# Patient Record
Sex: Female | Born: 1960 | Race: Black or African American | Hispanic: No | Marital: Single | Smoking: Current every day smoker
Health system: Southern US, Community
[De-identification: ages and names within clinical notes are randomized; demographics above are authoritative.]

## PROBLEM LIST (undated history)

## (undated) DIAGNOSIS — F200 Paranoid schizophrenia: Secondary | ICD-10-CM

---

## 2000-08-14 ENCOUNTER — Encounter: Admission: RE | Admit: 2000-08-14 | Discharge: 2000-08-14 | Payer: Self-pay | Admitting: *Deleted

## 2000-08-14 ENCOUNTER — Other Ambulatory Visit: Admission: RE | Admit: 2000-08-14 | Discharge: 2000-08-14 | Payer: Self-pay | Admitting: Internal Medicine

## 2004-08-11 ENCOUNTER — Ambulatory Visit: Payer: Self-pay | Admitting: Internal Medicine

## 2004-08-11 ENCOUNTER — Inpatient Hospital Stay (HOSPITAL_COMMUNITY): Admission: EM | Admit: 2004-08-11 | Discharge: 2004-08-14 | Payer: Self-pay | Admitting: Emergency Medicine

## 2005-03-31 ENCOUNTER — Inpatient Hospital Stay (HOSPITAL_COMMUNITY): Admission: RE | Admit: 2005-03-31 | Discharge: 2005-04-03 | Payer: Self-pay | Admitting: Obstetrics

## 2005-03-31 ENCOUNTER — Encounter (INDEPENDENT_AMBULATORY_CARE_PROVIDER_SITE_OTHER): Payer: Self-pay | Admitting: *Deleted

## 2008-07-25 ENCOUNTER — Emergency Department (HOSPITAL_COMMUNITY): Admission: EM | Admit: 2008-07-25 | Discharge: 2008-07-25 | Payer: Self-pay | Admitting: Emergency Medicine

## 2008-09-09 ENCOUNTER — Emergency Department (HOSPITAL_COMMUNITY): Admission: EM | Admit: 2008-09-09 | Discharge: 2008-09-09 | Payer: Self-pay | Admitting: Emergency Medicine

## 2008-09-15 ENCOUNTER — Emergency Department (HOSPITAL_BASED_OUTPATIENT_CLINIC_OR_DEPARTMENT_OTHER): Admission: EM | Admit: 2008-09-15 | Discharge: 2008-09-15 | Payer: Self-pay | Admitting: Emergency Medicine

## 2009-01-10 ENCOUNTER — Emergency Department (HOSPITAL_COMMUNITY): Admission: EM | Admit: 2009-01-10 | Discharge: 2009-01-10 | Payer: Self-pay | Admitting: Emergency Medicine

## 2009-02-05 ENCOUNTER — Emergency Department (HOSPITAL_COMMUNITY): Admission: EM | Admit: 2009-02-05 | Discharge: 2009-02-05 | Payer: Self-pay | Admitting: Emergency Medicine

## 2009-02-13 ENCOUNTER — Inpatient Hospital Stay (HOSPITAL_COMMUNITY): Admission: AD | Admit: 2009-02-13 | Discharge: 2009-02-24 | Payer: Self-pay | Admitting: Psychiatry

## 2009-02-13 ENCOUNTER — Other Ambulatory Visit: Payer: Self-pay | Admitting: Emergency Medicine

## 2009-02-13 ENCOUNTER — Ambulatory Visit: Payer: Self-pay | Admitting: Psychiatry

## 2009-05-06 ENCOUNTER — Emergency Department (HOSPITAL_COMMUNITY): Admission: EM | Admit: 2009-05-06 | Discharge: 2009-05-06 | Payer: Self-pay | Admitting: Emergency Medicine

## 2009-08-28 ENCOUNTER — Emergency Department (HOSPITAL_COMMUNITY): Admission: EM | Admit: 2009-08-28 | Discharge: 2009-08-28 | Payer: Self-pay | Admitting: Emergency Medicine

## 2009-09-10 ENCOUNTER — Emergency Department (HOSPITAL_COMMUNITY): Admission: EM | Admit: 2009-09-10 | Discharge: 2009-09-10 | Payer: Self-pay | Admitting: Emergency Medicine

## 2009-10-14 ENCOUNTER — Emergency Department (HOSPITAL_COMMUNITY): Admission: EM | Admit: 2009-10-14 | Discharge: 2009-10-14 | Payer: Self-pay | Admitting: Emergency Medicine

## 2009-11-24 ENCOUNTER — Emergency Department (HOSPITAL_COMMUNITY): Admission: EM | Admit: 2009-11-24 | Discharge: 2009-11-24 | Payer: Self-pay | Admitting: Emergency Medicine

## 2010-01-11 ENCOUNTER — Emergency Department (HOSPITAL_COMMUNITY): Admission: EM | Admit: 2010-01-11 | Discharge: 2010-01-11 | Payer: Self-pay | Admitting: Emergency Medicine

## 2011-02-02 LAB — CBC
Hemoglobin: 15.1 g/dL — ABNORMAL HIGH (ref 12.0–15.0)
Platelets: 163 10*3/uL (ref 150–400)
WBC: 5.3 10*3/uL (ref 4.0–10.5)

## 2011-02-02 LAB — BASIC METABOLIC PANEL
BUN: 9 mg/dL (ref 6–23)
CO2: 25 mEq/L (ref 19–32)
Chloride: 105 mEq/L (ref 96–112)
Creatinine, Ser: 0.77 mg/dL (ref 0.4–1.2)
GFR calc non Af Amer: >60 mL/min (ref >60)
Glucose, Bld: 84 mg/dL (ref 70–99)
Potassium: 4.3 mEq/L (ref 3.5–5.1)

## 2011-02-02 LAB — URINALYSIS, ROUTINE W REFLEX MICROSCOPIC
Hgb urine dipstick: NEGATIVE
Nitrite: NEGATIVE
Protein, ur: NEGATIVE mg/dL
Specific Gravity, Urine: 1.024 (ref 1.005–1.030)
pH: 6 (ref 5.0–8.0)

## 2011-02-02 LAB — DIFFERENTIAL
Eosinophils Absolute: 0.1 10*3/uL (ref 0.0–0.7)
Lymphocytes Relative: 40 % (ref 12–46)
Monocytes Absolute: 0.5 10*3/uL (ref 0.1–1.0)

## 2011-02-17 LAB — URINALYSIS, ROUTINE W REFLEX MICROSCOPIC
Bilirubin Urine: NEGATIVE
Glucose, UA: NEGATIVE mg/dL
Nitrite: NEGATIVE
Protein, ur: NEGATIVE mg/dL
Specific Gravity, Urine: 1.015 (ref 1.005–1.030)
Urobilinogen, UA: 0.2 mg/dL (ref 0.0–1.0)
pH: 7.5 (ref 5.0–8.0)

## 2011-02-17 LAB — WET PREP, GENITAL
Trich, Wet Prep: NONE SEEN
WBC, Wet Prep HPF POC: NONE SEEN

## 2011-02-17 LAB — DIFFERENTIAL
Basophils Relative: 0 % (ref 0–1)
Eosinophils Absolute: 0.1 10*3/uL (ref 0.0–0.7)
Eosinophils Relative: 1 % (ref 0–5)
Monocytes Relative: 7 % (ref 3–12)
Neutro Abs: 6.7 10*3/uL (ref 1.7–7.7)
Neutrophils Relative %: 75 % (ref 43–77)

## 2011-02-17 LAB — ETHANOL: Alcohol, Ethyl (B): <5 mg/dL (ref 0–10)

## 2011-02-17 LAB — BASIC METABOLIC PANEL
CO2: 27 mEq/L (ref 19–32)
Creatinine, Ser: 0.81 mg/dL (ref 0.4–1.2)
Sodium: 138 mEq/L (ref 135–145)

## 2011-02-17 LAB — CBC
Hemoglobin: 12.8 g/dL (ref 12.0–15.0)
MCHC: 33.5 g/dL (ref 30.0–36.0)
WBC: 9 10*3/uL (ref 4.0–10.5)

## 2011-02-17 LAB — RAPID URINE DRUG SCREEN, HOSP PERFORMED
Barbiturates: NOT DETECTED
Opiates: NOT DETECTED

## 2011-02-17 LAB — RPR: RPR Ser Ql: NONREACTIVE

## 2011-02-21 LAB — URINALYSIS, ROUTINE W REFLEX MICROSCOPIC
Glucose, UA: NEGATIVE mg/dL
Protein, ur: NEGATIVE mg/dL
Urobilinogen, UA: 1 mg/dL (ref 0.0–1.0)

## 2011-02-21 LAB — POCT PREGNANCY, URINE: Preg Test, Ur: NEGATIVE

## 2011-02-21 LAB — CK: Total CK: 122 U/L (ref 7–177)

## 2011-02-21 LAB — DIFFERENTIAL
Basophils Absolute: 0 10*3/uL (ref 0.0–0.1)
Eosinophils Absolute: 0.1 10*3/uL (ref 0.0–0.7)
Lymphocytes Relative: 30 % (ref 12–46)
Lymphs Abs: 1.9 10*3/uL (ref 0.7–4.0)

## 2011-02-21 LAB — BASIC METABOLIC PANEL
CO2: 28 mEq/L (ref 19–32)
Chloride: 104 mEq/L (ref 96–112)
GFR calc Af Amer: >60 mL/min (ref >60)
Potassium: 4.5 mEq/L (ref 3.5–5.1)

## 2011-02-21 LAB — CBC
Hemoglobin: 15.2 g/dL — ABNORMAL HIGH (ref 12.0–15.0)
RBC: 4.76 MIL/uL (ref 3.87–5.11)

## 2011-02-23 LAB — COMPREHENSIVE METABOLIC PANEL
ALT: 14 U/L (ref 0–35)
AST: 19 U/L (ref 0–37)
Alkaline Phosphatase: 69 U/L (ref 39–117)
CO2: 27 mEq/L (ref 19–32)
Calcium: 9.5 mg/dL (ref 8.4–10.5)
GFR calc Af Amer: >60 mL/min (ref >60)
Glucose, Bld: 97 mg/dL (ref 70–99)
Potassium: 4.6 mEq/L (ref 3.5–5.1)
Sodium: 137 mEq/L (ref 135–145)
Total Protein: 7.4 g/dL (ref 6.0–8.3)

## 2011-02-23 LAB — DIFFERENTIAL
Basophils Relative: 0 % (ref 0–1)
Eosinophils Absolute: 0 10*3/uL (ref 0.0–0.7)
Eosinophils Relative: 1 % (ref 0–5)
Lymphs Abs: 1.2 10*3/uL (ref 0.7–4.0)
Monocytes Relative: 8 % (ref 3–12)

## 2011-02-23 LAB — RAPID URINE DRUG SCREEN, HOSP PERFORMED
Cocaine: NOT DETECTED
Tetrahydrocannabinol: NOT DETECTED

## 2011-02-23 LAB — ACETAMINOPHEN LEVEL: Acetaminophen (Tylenol), Serum: <10 ug/mL — ABNORMAL LOW (ref 10–30)

## 2011-02-23 LAB — CBC
Hemoglobin: 16.5 g/dL — ABNORMAL HIGH (ref 12.0–15.0)
MCHC: 34.3 g/dL (ref 30.0–36.0)
RBC: 5.1 MIL/uL (ref 3.87–5.11)
RDW: 13.8 % (ref 11.5–15.5)

## 2011-02-23 LAB — ETHANOL: Alcohol, Ethyl (B): <5 mg/dL (ref 0–10)
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
## 2011-04-01 NOTE — Discharge Summary (Signed)
[name], [name]               ACCOUNT NO.:  [id number]   MEDICAL RECORD NO.:  [id number]          PATIENT TYPE:  INP   LOCATION:  9303                          [facility]ILITY:  [facility]   PHYSICIAN:  Charles A. Clearance Coots, M.D.DATE OF BIRTH:  July 19, 1961   DATE OF ADMISSION:  03/31/2005  DATE OF DISCHARGE:  04/03/2005                                 DISCHARGE SUMMARY   ADMITTING DIAGNOSES:  Symptomatic uterine fibroids.   DISCHARGE DIAGNOSES:  Symptomatic uterine fibroids status post total  abdominal hysterectomy, left salpingo-oophorectomy.  Discharged home in good  condition.   REASON FOR ADMISSION:  A 50 year old black female G0, last menstrual period  January 31, 2005 presents to the office with a history of large uterine  fibroids that are very uncomfortable.  Patient also gives a history of  heavy, painful periods.  She also has a history of anemia with a hemoglobin  as low at 6.1 in September 2005.   PAST MEDICAL HISTORY:  Significant for paranoid schizophrenia.  She is on  Haldol.   PAST SURGICAL HISTORY:  None.   ILLNESSES:  Paranoid schizophrenia.   MEDICATIONS:  1.  Haldol.  2.  Iron.  3.  Provera.   SOCIAL HISTORY:  Single.  Positive tobacco, alcohol.  Negative recreational  drug use.   PHYSICAL EXAMINATION:  GENERAL:  Well-nourished, well-developed black female  in no acute distress.  VITAL SIGNS:  Temperature 98.4, pulse 66, blood pressure 136/68.  HEENT:  Poor dentition.  NECK:  Supple without adenopathy.  LUNGS:  Clear to auscultation bilaterally.  HEART:  Regular rate and rhythm.  ABDOMEN:  Palpable uterus approximately 18 weeks.  PELVIC:  Uterus approximately 18 weeks size on bimanual examination.  The  adnexa could not be appreciated due to the size of the uterus.   IMPRESSION:  Symptomatic uterine fibroids.   PLAN:  Total abdominal hysterectomy.   ADMISSION LABORATORIES:  Hemoglobin 13, hematocrit 40, white blood cell  count 6400, platelet count 310,000.   Coags were within normal limits.  Comprehensive metabolic panel was within normal limits.  Urinalysis was  within normal limits.   HOSPITAL COURSE:  Patient underwent a total abdominal hysterectomy and left  salpingo-oophorectomy on Mar 31, 2005.  There were no intraoperative  complications.  Postoperative course was uncomplicated.  Patient was  discharged home on postoperative day #2 in good condition.   DISCHARGE LABORATORIES:  Hemoglobin 10, hematocrit 30, white blood cell  count 8000, platelets 226,000.   DISCHARGE DISPOSITION:  Tylox and ibuprofen was prescribed for pain.  Continue iron.  Routine written instructions were given for discharge after  hysterectomy.  Patient is to call the office for a follow-up appointment in  two weeks.       CAH/MEDQ  D:  04/26/2005  T:  04/26/2005  Job:  [id number]
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
## 2011-04-01 NOTE — Op Note (Signed)
NAMESIGNA, CHEEK               ACCOUNT NO.:  [id number]   MEDICAL RECORD NO.:  [id number]          PATIENT TYPE:  INP   LOCATION:  9399                          [facility]ACILITY:  [facility]   PHYSICIAN:  Charles A. Clearance Coots, M.D.DATE OF BIRTH:  August 09, 1961   DATE OF PROCEDURE:  03/31/2005  DATE OF DISCHARGE:                                 OPERATIVE REPORT   PREOPERATIVE DIAGNOSIS:  Symptomatic uterine fibroids.   POSTOPERATIVE DIAGNOSIS:  Symptomatic uterine fibroids.   PROCEDURE:  Total abdominal hysterectomy, left salpingo-oophorectomy.   SURGEON:  Coral Ceo, M.D.   ASSISTANT:  Antionette Char, M.D.   ANESTHESIA:  General.   ESTIMATED BLOOD LOSS:  500 mL.   IV FLUIDS:  1900 mL.   URINE OUTPUT:  150 mL, clear   COMPLICATIONS:  None.   DRAINS:  Foley to gravity   FINDINGS:  Large fibroid uterus approximately 18-week size, left ovary  tightly adherent to uterus.   SPECIMENS:  The uterus, cervix, left ovary, left fallopian tube.   OPERATION:  The patient was brought to the operating room and after  satisfactory general endotracheal anesthesia, the abdomen was prepped and  draped in the usual sterile fashion.  A Pfannenstiel skin incision was made  with a scalpel that was deepened down to the fascia with a scalpel. Fascia  was nicked in the midline and the fascial incision was extended to the left  and to right with curved Mayo scissors. The superior and inferior fascial  edges were taken off the rectus muscle with both blunt and sharp dissection.  Rectus muscle was bluntly and sharply divided in the midline. The peritoneum  was entered digitally and was sharply extended superiorly and inferiorly  being careful to avoid the urinary bladder inferiorly. The uterus was then  exteriorized and the round ligaments were grasped with Kelly forceps  bilaterally and transected and suture ligated with transfixion sutures of  zero Vicryl. The anterior peritoneal reflection of the  urinary bladder was  then undermined with Metzenbaum scissors and the incision was extended to  left and to the right with Metzenbaum scissors and the urinary bladder was  pushed down away from the lower uterine segment and cervix, well out of the  operative field. The broad ligament on the right side was then tented  distally, medial aspect of the broad ligament close to the uterus and the  pedicle including the broad ligament, fallopian tube, and utero-ovarian  ligaments were doubly clamps with parametrial forceps and cut in between  with curved Mayo scissors. Pedicle with free tie of zero Vicryl was then  placed beneath the clamp and a suture ligation of transfixion suture of zero  Vicryl was placed above the knot. On the left side the ovary was tightly  adhered to the side of the uterus and was not able to be freed with  dissection and the decision was made to proceed with left salpingo-  oophorectomy. Parametrial clamp was placed across the broad ligament,  fallopian tube through a window medially that was developed digitally in the  broad ligament and the second clamp was placed  beneath the first clamp and  the broad ligament and fallopian tubes were then transected.  Free tie of  zero Vicryl was placed beneath the clamp and transfixion suture of zero  Vicryl was placed above the knot.   The infundibulopelvic ligament was then isolated and was doubly clamped with  parametrial clamps beneath the ovary and the infundibulopelvic ligament was  then transected between the clamps and free tie of zero Vicryl was placed  beneath the clamp and second transfixion suture of zero Vicryl was placed  above the knot.  The uterine vessels were then isolated and doubly clamped  with the parametrial clamps and transected and suture ligated with  transfixion sutures of zero Vicryl bilaterally. The fundus of the uterus at  the junction of the internal os of the cervix was then transected for better   exposure and submitted to pathology for evaluation. The cervical stump was  then grasped with Kocher forceps and the procedure was continued with the  caudal ligaments clamped bilaterally with straight parametrial clamps,  transected and suture ligated with transfixion sutures of 0 Vicryl. The  cardinal ligaments were clamped, transected, and suture ligated with  transfixion sutures of zero Vicryl bilaterally down to the uterosacral  ligaments. The uterosacral ligaments were grasped with curved parametrial  clamp, transected, and suture ligated with transfixion sutures of zero  Vicryl. The vaginal cuff was then crossclamped with curved parametrial  clamps meeting in the center and the cervical stump was then transected with  the scalpel and submitted to pathology for evaluation. Corners of the  vaginal cuff were closed with transfixion suture of zero Vicryl and the  center of the vaginal cuff was closed with interrupted suture of zero  Vicryl.  Hemostasis was excellent. Pelvic cavity was then thoroughly  irrigated with warm saline solution and all clots were removed. The pedicles  were examined for hemostasis and there was no active bleeding in any of the  pedicles. The packing that had been placed was then removed and the Lenox Ahr retractor that also had been placed after removal of the uterine  fundus was also removed. Surgical technician indicated that all sponge  counts were correct. The abdomen was then closed as follows. The peritoneum  was closed with a continuous suture of 2-0 Monocryl. Fascia was closed with  continuous suture of zero PDS from each corner to the center.  Subcutaneous  tissue was thoroughly irrigated with warm saline solution and all areas of  subcutaneous bleeding were coagulated with Bovie. The skin was then closed  with continuous subcuticular suture of 3-0 Monocryl. Sterile bandage was applied to the incision. The surgical technician indicated that  all needle,  sponge and instrument counts were correct. The patient tolerated procedure  well and was transported to recovery in satisfactory condition.      CAH/MEDQ  D:  03/31/2005  T:  03/31/2005  Job:  [id number]

## 2011-04-29 ENCOUNTER — Encounter (HOSPITAL_COMMUNITY): Payer: Self-pay

## 2011-04-29 ENCOUNTER — Emergency Department (HOSPITAL_COMMUNITY): Payer: Medicare Other

## 2011-04-29 ENCOUNTER — Emergency Department (HOSPITAL_COMMUNITY)
Admission: EM | Admit: 2011-04-29 | Discharge: 2011-05-01 | Disposition: A | Discharge status: Other Acute Inpatient Hospital | Payer: Medicare Other | Source: Home / Self Care | Attending: Emergency Medicine | Admitting: Emergency Medicine

## 2011-04-29 DIAGNOSIS — R4182 Altered mental status, unspecified: Secondary | ICD-10-CM | POA: Insufficient documentation

## 2011-04-29 DIAGNOSIS — F209 Schizophrenia, unspecified: Secondary | ICD-10-CM | POA: Insufficient documentation

## 2011-04-29 DIAGNOSIS — R51 Headache: Secondary | ICD-10-CM | POA: Insufficient documentation

## 2011-04-29 DIAGNOSIS — F29 Unspecified psychosis not due to a substance or known physiological condition: Secondary | ICD-10-CM | POA: Insufficient documentation

## 2011-04-29 DIAGNOSIS — F200 Paranoid schizophrenia: Secondary | ICD-10-CM

## 2011-04-29 DIAGNOSIS — F411 Generalized anxiety disorder: Secondary | ICD-10-CM | POA: Insufficient documentation

## 2011-04-29 LAB — DIFFERENTIAL
Basophils Absolute: 0 10*3/uL (ref 0.0–0.1)
Basophils Relative: 0 % (ref 0–1)
Eosinophils Absolute: 0.2 10*3/uL (ref 0.0–0.7)
Eosinophils Relative: 2 % (ref 0–5)
Monocytes Absolute: 1 10*3/uL (ref 0.1–1.0)
Monocytes Relative: 10 % (ref 3–12)

## 2011-04-29 LAB — URINALYSIS, ROUTINE W REFLEX MICROSCOPIC
Bilirubin Urine: NEGATIVE
Glucose, UA: NEGATIVE mg/dL
Ketones, ur: NEGATIVE mg/dL
Nitrite: NEGATIVE
pH: 6.5 (ref 5.0–8.0)

## 2011-04-29 LAB — RAPID URINE DRUG SCREEN, HOSP PERFORMED
Amphetamines: NOT DETECTED
Barbiturates: NOT DETECTED
Benzodiazepines: NOT DETECTED
Cocaine: NOT DETECTED

## 2011-04-29 LAB — URINE MICROSCOPIC-ADD ON

## 2011-04-29 LAB — CBC
MCH: 30.1 pg (ref 26.0–34.0)
MCHC: 32.2 g/dL (ref 30.0–36.0)
Platelets: 193 10*3/uL (ref 150–400)
RDW: 13.2 % (ref 11.5–15.5)

## 2011-04-29 LAB — COMPREHENSIVE METABOLIC PANEL
ALT: 13 U/L (ref 0–35)
Albumin: 3.5 g/dL (ref 3.5–5.2)
Calcium: 9.3 mg/dL (ref 8.4–10.5)
GFR calc Af Amer: >60 mL/min (ref >60)
Glucose, Bld: 120 mg/dL — ABNORMAL HIGH (ref 70–99)
Sodium: 137 mEq/L (ref 135–145)
Total Protein: 6.9 g/dL (ref 6.0–8.3)

## 2011-04-30 ENCOUNTER — Inpatient Hospital Stay (HOSPITAL_COMMUNITY)
Admission: EM | Admit: 2011-04-30 | Payer: Medicare Other | Source: Other Acute Inpatient Hospital | Admitting: Psychiatry

## 2011-04-30 DIAGNOSIS — F200 Paranoid schizophrenia: Secondary | ICD-10-CM

## 2011-05-01 ENCOUNTER — Inpatient Hospital Stay (HOSPITAL_COMMUNITY)
Admission: RE | Admit: 2011-05-01 | Discharge: 2011-05-17 | DRG: 885 | Disposition: A | Discharge status: Home / Self Care | Payer: Medicare Other | Source: Ambulatory Visit | Attending: Psychiatry | Admitting: Psychiatry

## 2011-05-01 DIAGNOSIS — F1210 Cannabis abuse, uncomplicated: Secondary | ICD-10-CM

## 2011-05-01 DIAGNOSIS — M549 Dorsalgia, unspecified: Secondary | ICD-10-CM

## 2011-05-01 DIAGNOSIS — M25519 Pain in unspecified shoulder: Secondary | ICD-10-CM

## 2011-05-01 DIAGNOSIS — G8929 Other chronic pain: Secondary | ICD-10-CM

## 2011-05-01 DIAGNOSIS — F200 Paranoid schizophrenia: Principal | ICD-10-CM

## 2011-05-01 DIAGNOSIS — F1010 Alcohol abuse, uncomplicated: Secondary | ICD-10-CM

## 2011-05-02 DIAGNOSIS — F209 Schizophrenia, unspecified: Secondary | ICD-10-CM
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
## 2011-05-15 DIAGNOSIS — F209 Schizophrenia, unspecified: Secondary | ICD-10-CM
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
## 2011-08-15 LAB — BASIC METABOLIC PANEL
GFR calc non Af Amer: >60
Potassium: 3.8
Sodium: 138

## 2011-08-15 LAB — URINALYSIS, ROUTINE W REFLEX MICROSCOPIC
Nitrite: NEGATIVE
Specific Gravity, Urine: 1.005
pH: 6.5

## 2011-08-15 LAB — DIFFERENTIAL
Eosinophils Relative: 3
Lymphocytes Relative: 24
Lymphs Abs: 1.7
Monocytes Absolute: 0.6

## 2011-08-15 LAB — CBC
HCT: 38.2
Hemoglobin: 12.9
RBC: 4.15
WBC: 7.2

## 2012-02-09 ENCOUNTER — Emergency Department (HOSPITAL_COMMUNITY): Payer: Medicare Other

## 2012-02-09 ENCOUNTER — Encounter (HOSPITAL_COMMUNITY): Payer: Self-pay | Admitting: *Deleted

## 2012-02-09 ENCOUNTER — Emergency Department (HOSPITAL_COMMUNITY)
Admission: EM | Admit: 2012-02-09 | Discharge: 2012-02-10 | Disposition: A | Discharge status: Other Acute Inpatient Hospital | Payer: Medicare Other | Attending: Emergency Medicine | Admitting: Emergency Medicine

## 2012-02-09 DIAGNOSIS — M25559 Pain in unspecified hip: Secondary | ICD-10-CM | POA: Insufficient documentation

## 2012-02-09 DIAGNOSIS — M25569 Pain in unspecified knee: Secondary | ICD-10-CM | POA: Insufficient documentation

## 2012-02-09 DIAGNOSIS — F29 Unspecified psychosis not due to a substance or known physiological condition: Secondary | ICD-10-CM | POA: Insufficient documentation

## 2012-02-09 DIAGNOSIS — R413 Other amnesia: Secondary | ICD-10-CM | POA: Insufficient documentation

## 2012-02-09 HISTORY — DX: Paranoid schizophrenia: F20.0

## 2012-02-09 LAB — DIFFERENTIAL
Basophils Absolute: 0 10*3/uL (ref 0.0–0.1)
Eosinophils Relative: 2 % (ref 0–5)
Lymphocytes Relative: 31 % (ref 12–46)
Monocytes Absolute: 0.5 10*3/uL (ref 0.1–1.0)

## 2012-02-09 LAB — URINALYSIS, ROUTINE W REFLEX MICROSCOPIC
Bilirubin Urine: NEGATIVE
Glucose, UA: NEGATIVE mg/dL
Hgb urine dipstick: NEGATIVE
Ketones, ur: NEGATIVE mg/dL
Protein, ur: NEGATIVE mg/dL

## 2012-02-09 LAB — BASIC METABOLIC PANEL
BUN: 5 mg/dL — ABNORMAL LOW (ref 6–23)
CO2: 28 mEq/L (ref 19–32)
Calcium: 9.2 mg/dL (ref 8.4–10.5)
Creatinine, Ser: 0.63 mg/dL (ref 0.50–1.10)
Glucose, Bld: 86 mg/dL (ref 70–99)

## 2012-02-09 LAB — CBC
HCT: 43.1 % (ref 36.0–46.0)
MCV: 92.5 fL (ref 78.0–100.0)
RDW: 13.1 % (ref 11.5–15.5)
WBC: 5.2 10*3/uL (ref 4.0–10.5)

## 2012-02-09 LAB — RAPID URINE DRUG SCREEN, HOSP PERFORMED
Amphetamines: NOT DETECTED
Barbiturates: NOT DETECTED
Tetrahydrocannabinol: NOT DETECTED

## 2012-02-09 LAB — ETHANOL: Alcohol, Ethyl (B): 67 mg/dL — ABNORMAL HIGH (ref 0–11)

## 2012-02-09 MED ORDER — IBUPROFEN 600 MG PO TABS
600.0000 mg | ORAL_TABLET | Freq: Three times a day (TID) | ORAL | Status: DC | PRN
  Administered 2012-02-10: 600 mg via ORAL
  Filled 2012-02-09: qty 1

## 2012-02-09 MED ORDER — LORAZEPAM 1 MG PO TABS
1.0000 mg | ORAL_TABLET | Freq: Three times a day (TID) | ORAL | Status: DC | PRN
  Filled 2012-02-09: qty 1

## 2012-02-09 MED ORDER — ONDANSETRON HCL 4 MG PO TABS: 4.0000 mg | ORAL_TABLET | Freq: Three times a day (TID) | ORAL | Status: DC | PRN

## 2012-02-09 MED ORDER — ACETAMINOPHEN 325 MG PO TABS
650.0000 mg | ORAL_TABLET | ORAL | Status: DC | PRN
  Administered 2012-02-09 – 2012-02-10 (×2): 650 mg via ORAL
  Filled 2012-02-09: qty 2
  Filled 2012-02-09: qty 1
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
## 2012-02-09 NOTE — ED Notes (Signed)
Patient transported to X-ray 

## 2012-02-09 NOTE — ED Notes (Signed)
Pt. Ambulated to [facility] rm 41,  Pt.'s 4 white bags of belongings and 1  Black rolling suitcase, tagged with name stickers and placed in psych ed activity room.  Pt. Given sandwich/drink.  Pt. Not cooperative in answering questions wanted to sleep.  When asked if SI, states that she would be if we didn't stop getting in her business, denies HI, A/V/H.  Pt. Given 2nd blanket, will speak with pt. After she has rested a bit.

## 2012-02-09 NOTE — ED Notes (Signed)
Pt placed in blue scrubs and red socks. wanded by security. Pt has four bags and suit case

## 2012-02-09 NOTE — ED Notes (Signed)
Pt. Refused to answer any questions, will continue to monitor.

## 2012-02-09 NOTE — ED Notes (Signed)
MWN:UUV2<ZD> Expected date:02/09/12<BR> Expected time:12:58 PM<BR> Means of arrival:Ambulance<BR> Comments:<BR> M120. Female. Hip pain. Affecting walk. 15 yrs ongoing. req eval. CAOX4, vitals stable, ambulatory. 10 mins

## 2012-02-09 NOTE — ED Notes (Signed)
Pt is unable to be re-oriented.pt is refusing to get undressed

## 2012-02-09 NOTE — ED Notes (Signed)
Report given to psy nurse
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
## 2012-02-09 NOTE — ED Provider Notes (Signed)
Medical screening examination/treatment/procedure(s) were performed by non-physician practitioner and as supervising physician I was immediately available for consultation/collaboration.   Dayton Bailiff, MD 02/09/12 [phone]
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
## 2012-02-10 MED ORDER — MUSCLE RUB 10-15 % EX CREA
TOPICAL_CREAM | CUTANEOUS | Status: DC | PRN
  Filled 2012-02-10: qty 85

## 2012-02-10 MED ORDER — MAGNESIUM HYDROXIDE 400 MG/5ML PO SUSP
30.0000 mL | Freq: Every day | ORAL | Status: DC | PRN
  Filled 2012-02-10: qty 30

## 2012-02-10 NOTE — Discharge Planning (Signed)
Patient has been accepted to [facility] by Dr. Wendall Stade. Nurse should call report to [phone]. Patient has been IVC and will be transported by [facility] from [facility] ED to [facility]. EDP and patient's nurse notified.  Manson Passey Marquin Patino ANN S , MSW, LCSWA 02/10/2012 11:02 AM [phone]
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
## 2012-02-10 NOTE — Discharge Instructions (Signed)
Proceed directly to [facility] [place]. Psychosis Psychosis refers to a severe lack of understanding with reality. During a psychotic episode, you are not able to think clearly. During a psychotic episode, your responses and emotions are inappropriate and do not coincide with what is actually happening. You often have false beliefs about what is happening or who you are (delusions), and you may see, hear, taste, smell, or feel things that are not present (hallucinations). Psychosis is usually a severe symptom of a very serious mental health (psychiatric) condition, but it can sometimes be the result of a medical condition. CAUSES   Psychiatric conditions, such as:   Schizophrenia.   Bipolar disorder.   Depression.   Personality disorders.   Alcohol or drug abuse.   Medical conditions, such as:   Brain injury.   Brain tumor.   Dementia.   Brain diseases, such as Alzheimer's, Parkinson's, or Huntington's disease.   Neurological diseases, such as epilepsy.   Genetic disorders.   Metabolic disorders.   Infections that affect the brain.   Certain prescription drugs.   Stroke.  SYMPTOMS   Unable to think or speak clearly or respond appropriately.   Disorganized thinking (thoughts jump from one thought to another).   Severe inappropriate behavior.   Delusions may include:   A strong belief that is odd, unrealistic, or false.   Feeling extremely fearful or suspicious (paranoid).   Believing you are someone else, have high importance, or have an altered identity.   Hallucinations.  DIAGNOSIS   Mental health evaluation.   Physical exam.   Blood tests.   Computerized magnetic scan (MRI) or other brain scans.  TREATMENT  Your caregiver will recommend a course of treatment that depends on the cause of the psychosis. Treatment may include:  Monitoring and supportive care in the hospital.   Taking medicines (antipsychotic medicine) to reduce symptoms and balance  chemicals in the brain.   Taking medicines to manage underlying mental health conditions.   Therapy and other supportive programs outside of the hospital.   Treating an underlying medical condition.  If the cause of the psychosis can be treated or corrected, the outlook is good. Without treatment, psychotic episodes can cause danger to yourself or others. Treatment may be short-term or lifelong. HOME CARE INSTRUCTIONS   Take all medicines as directed. This is important.   Use a pillbox or write down your medicine schedule to make sure you are taking them.   Check with your caregiver before using over-the-counter medicines, herbs, or supplements.   Seek individual and family support through therapy and mental health education (psychoeducation) programs. These will help you manage symptoms and side effects of medicines, learn life skills, and maintain a healthy routine.   Maintain a healthy lifestyle.   Exercise regularly.   Avoid alcohol and drugs.   Learn ways to reduce stress and cope with stress, such as yoga and meditation.   Talk about your feelings with family members or caregivers.   Make time for yourself to do things you enjoy.   Know the early warning signs of psychosis. Your caregiver will recommend steps to take when you notice symptoms such as:   Feeling anxious or preoccupied.   Having racing thoughts.   Changes in your interest in life and relationships.   Follow up with your caregivers for continued outpatient treatment as directed.  SEEK MEDICAL CARE IF:   Medicines do not seem to be helping.   You hear voices telling you to do things.  You see, smell, or feel things that are not there.   You feel hopeless and overwhelmed.   You feel extremely fearful and suspicious that something will harm you.   You feel like you cannot leave your house.   You have trouble taking care of yourself.   You experience side effects of medicines, such as changes in  sleep patterns, dizziness, weight gain, restlessness, movement changes, muscle spasms, or tremors.  SEEK IMMEDIATE MEDICAL CARE IF:  Severe psychotic symptoms present a safety issue (such as an urge to hurt yourself or others). MAKE SURE YOU:   Understand these instructions.   Will watch your condition.   Will get help right away if you are not doing well or get worse.  FOR MORE INFORMATION  National Institute of Mental Health: http://www.maynard.net/ Document Released: 04/20/2010 Document Revised: 10/20/2011 Document Reviewed: 04/20/2010 [facility] Patient Information 2012 [place], [place].

## 2012-02-10 NOTE — ED Provider Notes (Signed)
Patient accepted [facility] [place]. She is in no distress. Vital signs are stable.  BP 152/86  Pulse 77  Temp(Src) 98.4 F (36.9 C) (Oral)  Resp 20  Ht 5\' 4"  (1.626 m)  SpO2 100%   Glynn Octave, MD 02/10/12 1238

## 2013-12-08 ENCOUNTER — Emergency Department (HOSPITAL_COMMUNITY)
Admission: EM | Admit: 2013-12-08 | Discharge: 2013-12-08 | Disposition: A | Discharge status: Home / Self Care | Payer: Medicare Other | Attending: Emergency Medicine | Admitting: Emergency Medicine

## 2013-12-08 DIAGNOSIS — M546 Pain in thoracic spine: Secondary | ICD-10-CM | POA: Insufficient documentation

## 2013-12-08 DIAGNOSIS — M549 Dorsalgia, unspecified: Secondary | ICD-10-CM

## 2013-12-08 DIAGNOSIS — M545 Low back pain, unspecified: Secondary | ICD-10-CM | POA: Insufficient documentation

## 2013-12-08 DIAGNOSIS — M542 Cervicalgia: Secondary | ICD-10-CM | POA: Insufficient documentation

## 2013-12-08 DIAGNOSIS — Z8659 Personal history of other mental and behavioral disorders: Secondary | ICD-10-CM | POA: Insufficient documentation

## 2013-12-08 MED ORDER — IBUPROFEN 800 MG PO TABS: 800.0000 mg | ORAL_TABLET | Freq: Three times a day (TID) | ORAL | Status: DC

## 2013-12-08 NOTE — ED Provider Notes (Signed)
CSN: [id number]     Arrival date & time 12/08/13  [id number] History   First MD Initiated Contact with Patient 12/08/13 [id number]     Chief Complaint  Patient presents with  . Back Pain  . Neck Pain   (Consider location/radiation/quality/duration/timing/severity/associated sxs/prior Treatment) Patient is a 53 y.o. female presenting with back pain and neck pain. The history is provided by the patient.  Back Pain Location:  Thoracic spine and lumbar spine Quality:  Stabbing Radiates to:  Does not radiate Pain severity:  Moderate Onset quality:  Gradual Duration:  2 days Timing:  Constant Progression:  Worsening Chronicity:  New Relieved by:  Nothing Worsened by:  Nothing tried Ineffective treatments:  None tried Neck Pain   Past Medical History  Diagnosis Date  . Paranoid schizophrenia    No past surgical history on file. No family history on file. History  Substance Use Topics  . Smoking status: Not on file  . Smokeless tobacco: Not on file  . Alcohol Use:    OB History   Grav Para Term Preterm Abortions TAB SAB Ect Mult Living                 Review of Systems  Musculoskeletal: Positive for back pain and neck pain.  All other systems reviewed and are negative.    Allergies  Codeine  Home Medications  No current outpatient prescriptions on file. BP 165/90  Pulse 63  Temp(Src) 97.6 F (36.4 C) (Oral)  Resp 18  SpO2 99% Physical Exam  Nursing note and vitals reviewed. Constitutional: She is oriented to person, place, and time. She appears well-developed and well-nourished. No distress.  HENT:  Head: Normocephalic and atraumatic.  Mouth/Throat: Oropharynx is clear and moist.  Neck: Normal range of motion. Neck supple.  Cardiovascular: Normal rate.   No murmur heard. Pulmonary/Chest: Effort normal.  Abdominal: Soft. Bowel sounds are normal. She exhibits no distension. There is no tenderness.  Musculoskeletal: Normal range of motion. She exhibits no edema.   There is tenderness to palpation in the soft tissues of the upper thoracic and lumbar regions.  Neurological: She is alert and oriented to person, place, and time.  Strength is 5 out of 5 in the bilateral lower extremities. Deep tendon reflexes are 2+ and equal bilaterally. She is able to ambulate without difficulty.  Skin: Skin is warm and dry. She is not diaphoretic.    ED Course  Procedures (including critical care time) Labs Review Labs Reviewed - No data to display Imaging Review No results found.    MDM  No diagnosis found. Patient is a 53 year old female who presents with complaints of upper and lower back pain. She is requesting a prescription for 800 mg ibuprofen. I see no evidence for acute emergent cause of her back pain. There are no bowel or bladder complaints and strength and reflexes are symmetrical. She will be discharged with this prescription and advised to return if she worsens.    Geoffery Lyonsouglas Joslyne Marshburn, MD 12/08/13 [phone]

## 2013-12-08 NOTE — Discharge Instructions (Signed)
Ibuprofen 800 mg every 8 hours as needed for pain.  Followup with your primary Dr. if not improving in the next week.   Back Pain, Adult Low back pain is very common. About 1 in 5 people have back pain.The cause of low back pain is rarely dangerous. The pain often gets better over time.About half of people with a sudden onset of back pain feel better in just 2 weeks. About 8 in 10 people feel better by 6 weeks.  CAUSES Some common causes of back pain include:  Strain of the muscles or ligaments supporting the spine.  Wear and tear (degeneration) of the spinal discs.  Arthritis.  Direct injury to the back. DIAGNOSIS Most of the time, the direct cause of low back pain is not known.However, back pain can be treated effectively even when the exact cause of the pain is unknown.Answering your caregiver's questions about your overall health and symptoms is one of the most accurate ways to make sure the cause of your pain is not dangerous. If your caregiver needs more information, he or she may order lab work or imaging tests (X-rays or MRIs).However, even if imaging tests show changes in your back, this usually does not require surgery. HOME CARE INSTRUCTIONS For many people, back pain returns.Since low back pain is rarely dangerous, it is often a condition that people can learn to [facility] LLCmanageon their own.   Remain active. It is stressful on the back to sit or stand in one place. Do not sit, drive, or stand in one place for more than 30 minutes at a time. Take short walks on level surfaces as soon as pain allows.Try to increase the length of time you walk each day.  Do not stay in bed.Resting more than 1 or 2 days can delay your recovery.  Do not avoid exercise or work.Your body is made to move.It is not dangerous to be active, even though your back may hurt.Your back will likely heal faster if you return to being active before your pain is gone.  Pay attention to your body when you bend  and lift. Many people have less discomfortwhen lifting if they bend their knees, keep the load close to their bodies,and avoid twisting. Often, the most comfortable positions are those that put less stress on your recovering back.  Find a comfortable position to sleep. Use a firm mattress and lie on your side with your knees slightly bent. If you lie on your back, put a pillow under your knees.  Only take over-the-counter or prescription medicines as directed by your caregiver. Over-the-counter medicines to reduce pain and inflammation are often the most helpful.Your caregiver may prescribe muscle relaxant drugs.These medicines help dull your pain so you can more quickly return to your normal activities and healthy exercise.  Put ice on the injured area.  Put ice in a plastic bag.  Place a towel between your skin and the bag.  Leave the ice on for 15-20 minutes, 03-04 times a day for the first 2 to 3 days. After that, ice and heat may be alternated to reduce pain and spasms.  Ask your caregiver about trying back exercises and gentle massage. This may be of some benefit.  Avoid feeling anxious or stressed.Stress increases muscle tension and can worsen back pain.It is important to recognize when you are anxious or stressed and learn ways to manage it.Exercise is a great option. SEEK MEDICAL CARE IF:  You have pain that is not relieved with rest or  medicine.  You have pain that does not improve in 1 week.  You have new symptoms.  You are generally not feeling well. SEEK IMMEDIATE MEDICAL CARE IF:   You have pain that radiates from your back into your legs.  You develop new bowel or bladder control problems.  You have unusual weakness or numbness in your arms or legs.  You develop nausea or vomiting.  You develop abdominal pain.  You feel faint. Document Released: 10/31/2005 Document Revised: 05/01/2012 Document Reviewed: 03/21/2011 [facility] Patient Information 2014  [place], [place].

## 2013-12-08 NOTE — ED Notes (Signed)
Pt to ED via [facility] for evaluation of head, neck, and back pain- denies injury.  Pt reports she just would like an evaluation and prescription for Motrin.  Pt ambulatory from EMS.

## 2015-11-30 ENCOUNTER — Encounter (HOSPITAL_COMMUNITY): Payer: Self-pay | Admitting: Emergency Medicine

## 2015-11-30 ENCOUNTER — Emergency Department (HOSPITAL_COMMUNITY)
Admission: EM | Admit: 2015-11-30 | Discharge: 2015-11-30 | Disposition: A | Discharge status: Home / Self Care | Payer: Medicare Other | Attending: Emergency Medicine | Admitting: Emergency Medicine

## 2015-11-30 DIAGNOSIS — Z791 Long term (current) use of non-steroidal anti-inflammatories (NSAID): Secondary | ICD-10-CM | POA: Insufficient documentation

## 2015-11-30 DIAGNOSIS — R05 Cough: Secondary | ICD-10-CM | POA: Diagnosis present

## 2015-11-30 DIAGNOSIS — J069 Acute upper respiratory infection, unspecified: Secondary | ICD-10-CM | POA: Insufficient documentation

## 2015-11-30 DIAGNOSIS — R630 Anorexia: Secondary | ICD-10-CM | POA: Diagnosis not present

## 2015-11-30 DIAGNOSIS — Z8659 Personal history of other mental and behavioral disorders: Secondary | ICD-10-CM | POA: Diagnosis not present

## 2015-11-30 NOTE — ED Provider Notes (Signed)
CSN: [id number]     Arrival date & time 11/30/15  1013 History   First MD Initiated Contact with Patient 11/30/15 1049     Chief Complaint  Patient presents with  . Cough     (Consider location/radiation/quality/duration/timing/severity/associated sxs/prior Treatment) HPI Comments: Patient presents today with a chief complaint of decreased appetite.  She reports that she had a cough and congestion for the past week, which is now improving.  She reports very mild occasional cough at this time.  However, her appetite is decreased and she feels as if she is dehydrated.  She is requesting iron pills to help her eat.  She denies SOB, chest pain, nausea, vomiting, or diarrhea.  She reports that she smokes 1-2 cigarettes a day.    Patient is a 55 y.o. female presenting with cough.  Cough   Past Medical History  Diagnosis Date  . Paranoid schizophrenia (HCC)    History reviewed. No pertinent past surgical history. No family history on file. Social History  Substance Use Topics  . Smoking status: None  . Smokeless tobacco: None  . Alcohol Use: None   OB History    No data available     Review of Systems  Respiratory: Positive for cough.   All other systems reviewed and are negative.     Allergies  Codeine  Home Medications   Prior to Admission medications   Medication Sig Start Date End Date Taking? Authorizing Provider  ibuprofen (ADVIL,MOTRIN) 800 MG tablet Take 1 tablet (800 mg total) by mouth 3 (three) times daily. 12/08/13   Geoffery Lyonsouglas Delo, MD   BP 149/94 mmHg  Pulse 101  Temp(Src) 98 F (36.7 C) (Oral)  Resp 18  SpO2 100% Physical Exam  Constitutional: She appears well-developed and well-nourished.  HENT:  Head: Normocephalic and atraumatic.  Mouth/Throat: Oropharynx is clear and moist.  Neck: Normal range of motion. Neck supple.  Cardiovascular: Normal rate and normal heart sounds.   Pulmonary/Chest: Effort normal and breath sounds normal. No respiratory  distress. She has no wheezes. She has no rales.  Musculoskeletal: Normal range of motion.  Neurological: She is alert.  Skin: Skin is warm and dry.  Psychiatric: She has a normal mood and affect. Her speech is tangential.  Nursing note and vitals reviewed.   ED Course  Procedures (including critical care time) Labs Review Labs Reviewed - No data to display  Imaging Review No results found. I have personally reviewed and evaluated these images and lab results as part of my medical decision-making.   EKG Interpretation None      MDM   Final diagnoses:  None   Patient presents today due to decreased appetite.  She reports that she recently had a URI, which has improved.  She requests an "iron pill" to help increase her appetite.  No signs of dehydration on exam. Afebrile.  VSS.  She does report a mild cough, which she reports is improving.  No hypoxia.  Lungs CTAB.   Feel that the patient is stable for discharge.  Return precautions given.      Santiago GladHeather Uniqua Kihn, PA-C 12/01/15 2217  Lorre NickAnthony Allen, MD 12/02/15 [phone]

## 2015-11-30 NOTE — ED Notes (Addendum)
Per [facility] pt here for evaluation for cough, vitamins, and bus pass. Pt talking to self upon triage; hx of behavioral health. Pt denies SI/HI or A/VH.

## 2015-11-30 NOTE — ED Notes (Signed)
Per Effie ShyWentz whom is aware of pt status and complaint; states need only work up for complaint.

## 2015-11-30 NOTE — Discharge Instructions (Signed)
Return to the Emergency Department if you develop thoughts of hurting yourself or others °

## 2015-12-10 ENCOUNTER — Emergency Department (HOSPITAL_COMMUNITY)
Admission: EM | Admit: 2015-12-10 | Discharge: 2015-12-11 | Disposition: A | Discharge status: Home / Self Care | Payer: Medicare Other | Attending: Emergency Medicine | Admitting: Emergency Medicine

## 2015-12-10 ENCOUNTER — Encounter (HOSPITAL_COMMUNITY): Payer: Self-pay | Admitting: Emergency Medicine

## 2015-12-10 DIAGNOSIS — F10120 Alcohol abuse with intoxication, uncomplicated: Secondary | ICD-10-CM | POA: Insufficient documentation

## 2015-12-10 DIAGNOSIS — F10129 Alcohol abuse with intoxication, unspecified: Secondary | ICD-10-CM | POA: Diagnosis present

## 2015-12-10 DIAGNOSIS — F17210 Nicotine dependence, cigarettes, uncomplicated: Secondary | ICD-10-CM | POA: Insufficient documentation

## 2015-12-10 DIAGNOSIS — Z791 Long term (current) use of non-steroidal anti-inflammatories (NSAID): Secondary | ICD-10-CM | POA: Diagnosis not present

## 2015-12-10 DIAGNOSIS — F10920 Alcohol use, unspecified with intoxication, uncomplicated: Secondary | ICD-10-CM

## 2015-12-10 NOTE — ED Notes (Addendum)
Per EMS Patient states, "There is semen in my vagina and it needs to come out. [facility] can use a syringe and get the semen out." Patient denies sexual assault. ETOH on board. Patient states she "only had a couple." Patient declines to elaborate Alert, oriented x4. Denies pain. Hx of behavioral health. Denies SI/HI

## 2015-12-10 NOTE — ED Notes (Signed)
Bed: [facility] Expected date:  Expected time:  Means of arrival:  Comments: EMS 54yo F ETOH

## 2015-12-10 NOTE — ED Provider Notes (Signed)
CSN: [id number]     Arrival date & time 12/10/15  2120 History   First MD Initiated Contact with Patient 12/10/15 2159     Chief Complaint  Patient presents with  . Alcohol Intoxication      The history is provided by the patient and medical records.   Patient presents to emergency department complaining of generalized weakness.  She does admit to drinking alcohol today.  She also stated to nursing staff that she wanted semen that was in her vagina to come out.  She did not complain of this to me.  She has no other significant complaints at this time.  She denies abdominal pain.  Denies nausea vomiting or diarrhea.  States she had several drinks today.  Denies other drug abuse.  Denies weakness of her arms or legs.   Past Medical History  Diagnosis Date  . Paranoid schizophrenia (HCC)    History reviewed. No pertinent past surgical history. No family history on file. Social History  Substance Use Topics  . Smoking status: Current Every Day Smoker -- 0.50 packs/day    Types: Cigarettes  . Smokeless tobacco: None  . Alcohol Use: Yes   OB History    No data available     Review of Systems  All other systems reviewed and are negative.     Allergies  Codeine  Home Medications   Prior to Admission medications   Medication Sig Start Date End Date Taking? Authorizing Provider  ibuprofen (ADVIL,MOTRIN) 800 MG tablet Take 1 tablet (800 mg total) by mouth 3 (three) times daily. 12/08/13   Geoffery Lyons, MD   BP 120/80 mmHg  Pulse 81  Temp(Src) 97.8 F (36.6 C) (Oral)  Resp 16  SpO2 96% Physical Exam  Constitutional: She is oriented to person, place, and time. She appears well-developed and well-nourished. No distress.  HENT:  Head: Normocephalic and atraumatic.  Eyes: EOM are normal.  Neck: Normal range of motion.  Cardiovascular: Normal rate, regular rhythm and normal heart sounds.   Pulmonary/Chest: Effort normal and breath sounds normal.  Abdominal: Soft. She  exhibits no distension. There is no tenderness.  Musculoskeletal: Normal range of motion.  Neurological: She is alert and oriented to person, place, and time.  Skin: Skin is warm and dry.  Psychiatric: She has a normal mood and affect. Judgment normal.  Nursing note and vitals reviewed.   ED Course  Procedures (including critical care time) Labs Review Labs Reviewed - No data to display  Imaging Review No results found. I have personally reviewed and evaluated these images and lab results as part of my medical decision-making.   EKG Interpretation None      MDM   Final diagnoses:  Alcohol intoxication, uncomplicated (HCC)    Overall the patient is well-appearing.  Her vital signs are stable.  She has no other complaints.  Patient will be ambulated in the emergency department and discharged home when she can ambulate safely    Azalia Bilis, MD 12/11/15 0045

## 2015-12-10 NOTE — ED Notes (Signed)
Patient speaking quietly to herself. Patient alert, oriented, and in no acute distress

## 2015-12-11 NOTE — ED Notes (Addendum)
This RN and Jerlyn Ly, NT ambulated patient to restroom. Patient still appears to be unsteady on feet. Will give patient something to eat and drink

## 2015-12-11 NOTE — ED Notes (Signed)
Encouraged patient to eat sandwich and drink cranberry juice. Patient states she is trying to eat, but none of the sandwich or drink has been consumed.

## 2015-12-11 NOTE — ED Notes (Addendum)
Vickey Huger, RN ambulating patient. Patient ambulating without difficulty.

## 2016-01-27 ENCOUNTER — Emergency Department (HOSPITAL_COMMUNITY): Payer: Medicare Other

## 2016-01-27 ENCOUNTER — Encounter (HOSPITAL_COMMUNITY): Payer: Self-pay | Admitting: *Deleted

## 2016-01-27 ENCOUNTER — Inpatient Hospital Stay (HOSPITAL_COMMUNITY)
Admission: EM | Admit: 2016-01-27 | Discharge: 2016-02-04 | DRG: 193 | Disposition: A | Discharge status: Skilled Nursing Facility | Payer: Medicare Other | Attending: Internal Medicine | Admitting: Internal Medicine

## 2016-01-27 DIAGNOSIS — J101 Influenza due to other identified influenza virus with other respiratory manifestations: Secondary | ICD-10-CM | POA: Diagnosis present

## 2016-01-27 DIAGNOSIS — J9601 Acute respiratory failure with hypoxia: Secondary | ICD-10-CM | POA: Diagnosis present

## 2016-01-27 DIAGNOSIS — J181 Lobar pneumonia, unspecified organism: Secondary | ICD-10-CM | POA: Diagnosis present

## 2016-01-27 DIAGNOSIS — Z9114 Patient's other noncompliance with medication regimen: Secondary | ICD-10-CM

## 2016-01-27 DIAGNOSIS — I10 Essential (primary) hypertension: Secondary | ICD-10-CM | POA: Diagnosis present

## 2016-01-27 DIAGNOSIS — Z8659 Personal history of other mental and behavioral disorders: Secondary | ICD-10-CM | POA: Diagnosis not present

## 2016-01-27 DIAGNOSIS — M1611 Unilateral primary osteoarthritis, right hip: Secondary | ICD-10-CM | POA: Diagnosis present

## 2016-01-27 DIAGNOSIS — Z8249 Family history of ischemic heart disease and other diseases of the circulatory system: Secondary | ICD-10-CM | POA: Diagnosis not present

## 2016-01-27 DIAGNOSIS — J1008 Influenza due to other identified influenza virus with other specified pneumonia: Principal | ICD-10-CM | POA: Diagnosis present

## 2016-01-27 DIAGNOSIS — Z9119 Patient's noncompliance with other medical treatment and regimen: Secondary | ICD-10-CM

## 2016-01-27 DIAGNOSIS — F200 Paranoid schizophrenia: Secondary | ICD-10-CM | POA: Diagnosis present

## 2016-01-27 DIAGNOSIS — J1100 Influenza due to unidentified influenza virus with unspecified type of pneumonia: Secondary | ICD-10-CM | POA: Diagnosis not present

## 2016-01-27 DIAGNOSIS — F17210 Nicotine dependence, cigarettes, uncomplicated: Secondary | ICD-10-CM | POA: Diagnosis present

## 2016-01-27 DIAGNOSIS — Z91199 Patient's noncompliance with other medical treatment and regimen due to unspecified reason: Secondary | ICD-10-CM

## 2016-01-27 DIAGNOSIS — J189 Pneumonia, unspecified organism: Secondary | ICD-10-CM | POA: Diagnosis not present

## 2016-01-27 DIAGNOSIS — J111 Influenza due to unidentified influenza virus with other respiratory manifestations: Secondary | ICD-10-CM | POA: Diagnosis not present

## 2016-01-27 DIAGNOSIS — R509 Fever, unspecified: Secondary | ICD-10-CM | POA: Diagnosis present

## 2016-01-27 DIAGNOSIS — M25551 Pain in right hip: Secondary | ICD-10-CM

## 2016-01-27 DIAGNOSIS — Z590 Homelessness: Secondary | ICD-10-CM

## 2016-01-27 DIAGNOSIS — J09X1 Influenza due to identified novel influenza A virus with pneumonia: Secondary | ICD-10-CM | POA: Diagnosis not present

## 2016-01-27 LAB — COMPREHENSIVE METABOLIC PANEL
ALT: 16 U/L (ref 14–54)
ANION GAP: 7 (ref 5–15)
AST: 21 U/L (ref 15–41)
Albumin: 3.8 g/dL (ref 3.5–5.0)
Alkaline Phosphatase: 76 U/L (ref 38–126)
BUN: 10 mg/dL (ref 6–20)
CHLORIDE: 105 mmol/L (ref 101–111)
CO2: 22 mmol/L (ref 22–32)
Calcium: 8.5 mg/dL — ABNORMAL LOW (ref 8.9–10.3)
Creatinine, Ser: 0.67 mg/dL (ref 0.44–1.00)
Glucose, Bld: 100 mg/dL — ABNORMAL HIGH (ref 65–99)
POTASSIUM: 3.6 mmol/L (ref 3.5–5.1)
Sodium: 134 mmol/L — ABNORMAL LOW (ref 135–145)
Total Bilirubin: 0.7 mg/dL (ref 0.3–1.2)
Total Protein: 7 g/dL (ref 6.5–8.1)

## 2016-01-27 LAB — CBC WITH DIFFERENTIAL/PLATELET
BASOS ABS: 0 10*3/uL (ref 0.0–0.1)
Basophils Relative: 0 %
EOS PCT: 0 %
Eosinophils Absolute: 0 10*3/uL (ref 0.0–0.7)
HCT: 41.4 % (ref 36.0–46.0)
Hemoglobin: 13 g/dL (ref 12.0–15.0)
LYMPHS ABS: 0.4 10*3/uL — AB (ref 0.7–4.0)
LYMPHS PCT: 7 %
MCH: 30.2 pg (ref 26.0–34.0)
MCHC: 31.4 g/dL (ref 30.0–36.0)
MCV: 96.1 fL (ref 78.0–100.0)
MONO ABS: 0.7 10*3/uL (ref 0.1–1.0)
Monocytes Relative: 12 %
NEUTROS PCT: 81 %
Neutro Abs: 4.6 10*3/uL (ref 1.7–7.7)
PLATELETS: 179 10*3/uL (ref 150–400)
RBC: 4.31 MIL/uL (ref 3.87–5.11)
RDW: 12.7 % (ref 11.5–15.5)
WBC: 5.6 10*3/uL (ref 4.0–10.5)

## 2016-01-27 LAB — I-STAT CG4 LACTIC ACID, ED
LACTIC ACID, VENOUS: 0.41 mmol/L — AB (ref 0.5–2.0)
Lactic Acid, Venous: 0.53 mmol/L (ref 0.5–2.0)

## 2016-01-27 LAB — INFLUENZA PANEL BY PCR (TYPE A & B)
H1N1 flu by pcr: NOT DETECTED
INFLAPCR: POSITIVE — AB
Influenza B By PCR: NEGATIVE

## 2016-01-27 LAB — HEMOGLOBIN A1C: Hemoglobin A1C: 6.7

## 2016-01-27 LAB — PROTIME-INR
INR: 1.15 (ref 0.00–1.49)
PROTHROMBIN TIME: 14.9 s (ref 11.6–15.2)

## 2016-01-27 LAB — ETHANOL

## 2016-01-27 LAB — AMMONIA: Ammonia: 19 umol/L (ref 9–35)

## 2016-01-27 LAB — I-STAT TROPONIN, ED: TROPONIN I, POC: 0.03 ng/mL (ref 0.00–0.08)

## 2016-01-27 LAB — MRSA PCR SCREENING: MRSA BY PCR: NEGATIVE

## 2016-01-27 MED ORDER — DEXTROSE 5 % IV SOLN
500.0000 mg | INTRAVENOUS | Status: DC
  Administered 2016-01-28 – 2016-01-30 (×3): 500 mg via INTRAVENOUS
  Filled 2016-01-27 (×3): qty 500

## 2016-01-27 MED ORDER — IPRATROPIUM-ALBUTEROL 0.5-2.5 (3) MG/3ML IN SOLN: 3.0000 mL | RESPIRATORY_TRACT | Status: DC | PRN

## 2016-01-27 MED ORDER — SODIUM CHLORIDE 0.9 % IV BOLUS (SEPSIS)
500.0000 mL | Freq: Once | INTRAVENOUS | Status: AC
  Administered 2016-01-27: 500 mL via INTRAVENOUS

## 2016-01-27 MED ORDER — IPRATROPIUM-ALBUTEROL 0.5-2.5 (3) MG/3ML IN SOLN
3.0000 mL | Freq: Once | RESPIRATORY_TRACT | Status: AC
  Administered 2016-01-27: 3 mL via RESPIRATORY_TRACT
  Filled 2016-01-27: qty 3

## 2016-01-27 MED ORDER — ACETAMINOPHEN 325 MG PO TABS
650.0000 mg | ORAL_TABLET | ORAL | Status: DC | PRN
  Administered 2016-01-27 – 2016-02-03 (×11): 650 mg via ORAL
  Filled 2016-01-27 (×12): qty 2

## 2016-01-27 MED ORDER — DEXTROSE 5 % IV SOLN: 1.0000 g | INTRAVENOUS | Status: DC

## 2016-01-27 MED ORDER — AZITHROMYCIN 250 MG PO TABS
500.0000 mg | ORAL_TABLET | Freq: Once | ORAL | Status: AC
  Administered 2016-01-27: 500 mg via ORAL
  Filled 2016-01-27: qty 2

## 2016-01-27 MED ORDER — SODIUM CHLORIDE 0.9 % IV SOLN
INTRAVENOUS | Status: DC
  Administered 2016-01-27: 16:00:00 via INTRAVENOUS

## 2016-01-27 MED ORDER — OSELTAMIVIR PHOSPHATE 75 MG PO CAPS
75.0000 mg | ORAL_CAPSULE | Freq: Two times a day (BID) | ORAL | Status: AC
  Administered 2016-01-27 – 2016-02-01 (×10): 75 mg via ORAL
  Filled 2016-01-27 (×10): qty 1

## 2016-01-27 MED ORDER — CEFTRIAXONE SODIUM 1 G IJ SOLR
1.0000 g | INTRAMUSCULAR | Status: DC
  Administered 2016-01-27 – 2016-01-29 (×3): 1 g via INTRAVENOUS
  Filled 2016-01-27 (×4): qty 10

## 2016-01-27 MED ORDER — SODIUM CHLORIDE 0.9 % IV BOLUS (SEPSIS)
1000.0000 mL | Freq: Once | INTRAVENOUS | Status: AC
  Administered 2016-01-27: 1000 mL via INTRAVENOUS

## 2016-01-27 MED ORDER — ACETAMINOPHEN 325 MG PO TABS: 650.0000 mg | ORAL_TABLET | Freq: Once | ORAL | Status: DC

## 2016-01-27 MED ORDER — AZITHROMYCIN 500 MG IV SOLR: 500.0000 mg | INTRAVENOUS | Status: DC

## 2016-01-27 MED ORDER — ACETAMINOPHEN 325 MG PO TABS
650.0000 mg | ORAL_TABLET | Freq: Once | ORAL | Status: AC | PRN
  Administered 2016-01-27: 650 mg via ORAL
  Filled 2016-01-27: qty 2

## 2016-01-27 NOTE — Progress Notes (Signed)
Pt had two orders for flu/H1N1 screening. Pt had screening performed already and is Flu A positive. Second order for flu/H1N1 test discontinued due to duplicate order.

## 2016-01-27 NOTE — ED Notes (Signed)
Bed: [id number] Expected date:  Expected time:  Means of arrival:  Comments: EMS- elderly

## 2016-01-27 NOTE — H&P (Addendum)
[facility] History and Physical  [name] ZOX:[id number] DOB: Dec 15, 1960 DOA: 01/27/2016  Referring physician: ER physician: Dr Thomasene Lot  PCP: No PCP Per Patient - will set up with [facility] on discharge   Chief Complaint: cough, fever  HPI:  55 year old female with past medical history of schizophrenia, homeless who presented to [facility] ED for evaluation of cough and fever. Apparently she was found outside of the restaurant wondering and because of cough and not feeling well she was brought in for evaluation. Pt is somewhat of a fair historian, does not answer questions consistently. She is mentioning her body being a skeleton. When asked about her fever and cough she said she is not feeling well. No respiratory distress. No abdominal pain, nausea or vomiting. No chest pain. No diarrhea or constipation.   In ED, pt was hemodynamically stable. Her Tmax was 100.8 F, HR 129, RR 35. Please note sepsis criteria not met. CXR showed bronchitic changes, no evidence of pneumonia. Started on azithro and rocephin. She was admitted for management of possible pneumonia.    Assessment & Plan    Principal Problem:   Acute respiratory failure with hypoxia (HCC) / Lobar pneumonia, unspecified organism ([place]) - Pt with oxygen saturation of 92% with [place] oxygen support - Hypoxia likely from pneumonia although no evidence of pneumonia on CXR other than chronic bronchitis - We are treating for possible clinical pneumonia considering fever and cough - Pneumonia order set placed - Started azithromycin and rocephin - F/U blood and resp cultures - Follow up influenza, resp virus panel, legionella, strep pneumonia - Added duoneb every 4 hours as needed for shortness of breath or wheezing   Active Problems:   History of schizophrenia - Not on any meds - Psych consulted   DVT prophylaxis:  - SCD's bilaterally   Radiological Exams on Admission: Dg Chest 2 View 01/27/2016  Central airway thickening and  interstitial opacities, potentially related to acute or acute on chronic bronchitis. No evidence of lobar pneumonia. Signed, Dulcy Fanny. Earleen Newport, DO [facility] Radiology Electronically Signed   By: Corrie Mckusick D.O.   On: 01/27/2016 10:03    EKG: I have personally reviewed EKG. EKG shows sinus tachycardia  Code Status: Full Family Communication: Plan of care discussed with the patient  Disposition Plan: Admit for further evaluation, medical floor   Leisa Lenz, MD  [facility] Pager [phone]  Time spent in minutes: 75 minutes  Review of Systems:  Constitutional: Negative for chills and malaise/fatigue. Negative for diaphoresis.  HENT: Negative for hearing loss, ear pain, nosebleeds, congestion, sore throat, neck pain, tinnitus and ear discharge.   Eyes: Negative for blurred vision, double vision, photophobia, pain, discharge and redness.  Respiratory: Negative for hemoptysis, sputum production, shortness of breath and stridor.   Cardiovascular: Negative for chest pain, palpitations, orthopnea, claudication and leg swelling.  Gastrointestinal: Negative for nausea, vomiting and abdominal pain. Negative for heartburn, constipation, blood in stool and melena.  Genitourinary: Negative for dysuria, urgency, frequency, hematuria and flank pain.  Musculoskeletal: Negative for myalgias, back pain, joint pain and falls.  Skin: Negative for itching and rash.  Neurological: Negative for dizziness and weakness. Negative for tingling, tremors, sensory change, speech change, focal weakness, loss of consciousness and headaches.  Endo/Heme/Allergies: Negative for environmental allergies and polydipsia. Does not bruise/bleed easily.  Psychiatric/Behavioral: Negative for suicidal ideas. The patient is not nervous/anxious.      Past Medical History  Diagnosis Date  . Paranoid schizophrenia (  [place])    History reviewed. No pertinent past surgical  history. Social History:  reports that she has been smoking Cigarettes.  She has been smoking about 0.50 packs per day. She does not have any smokeless tobacco history on file. She reports that she drinks alcohol. She reports that she does not use illicit drugs.  Allergies  Allergen Reactions  . Codeine Other (See Comments)    Makes her sleepy    Family History: Hypertension in mother    Prior to Admission medications   Medication Sig Start Date End Date Taking? Authorizing Provider  ibuprofen (ADVIL,MOTRIN) 200 MG tablet Take 200-800 mg by mouth every 6 (six) hours as needed for moderate pain.   Yes Historical Provider, MD  ibuprofen (ADVIL,MOTRIN) 800 MG tablet Take 1 tablet (800 mg total) by mouth 3 (three) times daily. Patient not taking: Reported on 01/27/2016 12/08/13   Veryl Speak, MD   Physical Exam: Filed Vitals:   01/27/16 1141 01/27/16 1221 01/27/16 1230 01/27/16 1300  BP:  147/79 161/91 143/82  Pulse:  100 129 95  Temp: 99.1 F (37.3 C)     TempSrc: Oral     Resp:  _0 SpO2:  92% 100% 100%    Physical Exam  Constitutional: Appears well-developed and well-nourished. No distress.  HENT: Normocephalic. No tonsillar erythema or exudates Eyes: Conjunctivae are normal. No scleral icterus.  Neck: Normal ROM. Neck supple. No JVD. No tracheal deviation. No thyromegaly.  CVS: Tachycardic, S1/S2 +, no murmurs, no gallops, no carotid bruit.  Pulmonary: wheezing in upper lung lobes, no rhonchi .  Abdominal: Soft. BS +,  no distension, tenderness, rebound or guarding.  Musculoskeletal: Normal range of motion. No edema and no tenderness.  Lymphadenopathy: No lymphadenopathy noted, cervical, inguinal. Neuro: Alert. Normal reflexes, muscle tone coordination. No focal neurologic deficits. Skin: Skin is warm and dry. No rash noted.  No erythema. No pallor.  Psychiatric: Normal mood and affect. Behavior, judgment, thought content normal.   Labs on Admission:  Basic Metabolic  Panel:  Recent Labs Lab 01/27/16 1019  NA 134*  K 3.6  CL 105  CO2 22  GLUCOSE 100*  BUN 10  CREATININE 0.67  CALCIUM 8.5*   Liver Function Tests:  Recent Labs Lab 01/27/16 1019  AST 21  ALT 16  ALKPHOS 76  BILITOT 0.7  PROT 7.0  ALBUMIN 3.8   No results for input(s): LIPASE, AMYLASE in the last 168 hours.  Recent Labs Lab 01/27/16 1019  AMMONIA 19   CBC:  Recent Labs Lab 01/27/16 1019  WBC 5.6  NEUTROABS 4.6  HGB 13.0  HCT 41.4  MCV 96.1  PLT 179   Cardiac Enzymes: No results for input(s): CKTOTAL, CKMB, CKMBINDEX, TROPONINI in the last 168 hours. BNP: Invalid input(s): POCBNP CBG: No results for input(s): GLUCAP in the last 168 hours.  If 7PM-7AM, please contact night-coverage [email or web address] Password [email or web address] 01/27/2016, 1:47 PM

## 2016-01-27 NOTE — Progress Notes (Signed)
Pharmacy Antibiotic Follow-up Note  [name] is a 55 y.o. year-old female admitted on 01/27/2016.  The patient is currently on day 1/7 of Rocephin & Azithromycin for CAP .  Assessment/Plan: This patient's current antibiotics will be continued without adjustments.  Temp (24hrs), Avg:100 F (37.8 C), Min:99.1 F (37.3 C), Max:100.8 F (38.2 C)   Recent Labs Lab 01/27/16 1019  WBC 5.6    Recent Labs Lab 01/27/16 1019  CREATININE 0.67   CrCl cannot be calculated (Unknown ideal weight.).    Allergies  Allergen Reactions  . Codeine Other (See Comments)    Makes her sleepy    Antimicrobials this admission: 3/15 Azithromycin >> 3/21 3/15 Rocephin >> 3/21  Microbiology results: 3/15 BCx: sent ? Sputum: ordered  ? Resp virus panel: ordered  Thank you for allowing pharmacy to be a part of this patient's care.  Otho BellowsGreen, Finlee Concepcion L PharmD 01/27/2016 2:04 PM

## 2016-01-27 NOTE — ED Notes (Signed)
Per EMS report: pt picked up from [employer] and presents to ED with c/o fever, chills, and cough.  Pt reports having green sputum and no appetite for 1 day.

## 2016-01-27 NOTE — ED Notes (Signed)
Ambulated patient-oxygen saturation 88% on RA.

## 2016-01-27 NOTE — ED Provider Notes (Signed)
CSN: [id number]     Arrival date & time 01/27/16  0847 History   First MD Initiated Contact with Patient 01/27/16 [phone]     Chief Complaint  Patient presents with  . Fever  . Cough     (Consider location/radiation/quality/duration/timing/severity/associated sxs/prior Treatment) HPI   Patient is a 55 year old female with history of paranoid schizophrenia. She is presenting today with being found outside a restaurant named [employer]. Patient is difficult to have a conversation with. She states "I have the skeleton of a [age]-year-old" when asked what is bothering her today.  Patient is notably febrile, increased respiratory rate.  Level V caveat psychiatric disorder  Past Medical History  Diagnosis Date  . Paranoid schizophrenia (HCC)    History reviewed. No pertinent past surgical history. No family history on file. Social History  Substance Use Topics  . Smoking status: Current Every Day Smoker -- 0.50 packs/day    Types: Cigarettes  . Smokeless tobacco: None  . Alcohol Use: Yes   OB History    No data available     Review of Systems  Unable to perform ROS: Psychiatric disorder      Allergies  Codeine  Home Medications   Prior to Admission medications   Medication Sig Start Date End Date Taking? Authorizing Provider  ibuprofen (ADVIL,MOTRIN) 200 MG tablet Take 200-800 mg by mouth every 6 (six) hours as needed for moderate pain.   Yes Historical Provider, MD  ibuprofen (ADVIL,MOTRIN) 800 MG tablet Take 1 tablet (800 mg total) by mouth 3 (three) times daily. Patient not taking: Reported on 01/27/2016 12/08/13   Geoffery Lyonsouglas Delo, MD   BP 143/82 mmHg  Pulse 95  Temp(Src) 99.1 F (37.3 C) (Oral)  Resp 23  SpO2 100% Physical Exam  Constitutional: She appears well-developed and well-nourished.  Febrile  HENT:  Head: Normocephalic and atraumatic.  Eyes: Conjunctivae are normal. Right eye exhibits no discharge.  Neck: Neck supple. No thyromegaly present.  No meningismus   Cardiovascular: Regular rhythm and normal heart sounds.   No murmur heard. Tachycardic  Pulmonary/Chest: She has wheezes. She has no rales.  Mild tachypnea. Mild wheezes.  Abdominal: Soft. She exhibits no distension. There is no tenderness.  Musculoskeletal: Normal range of motion.  Neurological: No cranial nerve deficit.  Patient unable to state name place or date.  Skin: Skin is warm and dry. No rash noted. She is not diaphoretic.  Psychiatric:  Odd affect, disjointed speech.  Nursing note and vitals reviewed.   ED Course  Procedures (including critical care time) Labs Review Labs Reviewed  CBC WITH DIFFERENTIAL/PLATELET - Abnormal; Notable for the following:    Lymphs Abs 0.4 (*)    All other components within normal limits  COMPREHENSIVE METABOLIC PANEL - Abnormal; Notable for the following:    Sodium 134 (*)    Glucose, Bld 100 (*)    Calcium 8.5 (*)    All other components within normal limits  PROTIME-INR  AMMONIA  URINALYSIS, ROUTINE W REFLEX MICROSCOPIC (NOT AT [facility])  URINE RAPID DRUG SCREEN, HOSP PERFORMED  INFLUENZA PANEL BY PCR (TYPE A & B, H1N1)  I-STAT TROPOININ, ED  I-STAT CG4 LACTIC ACID, ED  I-STAT CG4 LACTIC ACID, ED    Imaging Review Dg Chest 2 View  01/27/2016  CLINICAL DATA:  55 year old female with a history of weakness fever and cough. Lethargy. EXAM: CHEST - 2 VIEW COMPARISON:  08/11/2004 FINDINGS: Cardiomediastinal silhouette unchanged in size and contour. Interstitial opacities with peribronchial thickening. No confluent airspace disease.  No pleural effusion. No pneumothorax. No displaced fracture. Unremarkable appearance of the upper abdomen. IMPRESSION: Central airway thickening and interstitial opacities, potentially related to acute or acute on chronic bronchitis. No evidence of lobar pneumonia. Signed, Yvone Neu. Loreta Ave, DO [facility] Radiology Electronically Signed   By: Gilmer Mor D.O.   On:  01/27/2016 10:03   I have personally reviewed and evaluated these images and lab results as part of my medical decision-making.   EKG Interpretation   Date/Time:  Wednesday January 27 2016 09:24:59 EDT Ventricular Rate:  102 PR Interval:  152 QRS Duration: 88 QT Interval:  371 QTC Calculation: 483 R Axis:   85 Text Interpretation:  Sinus tachycardia Right atrial enlargement Consider  left ventricular hypertrophy no acute ischemia Confirmed by Corlis Leak,  COURTNEY ([id number]) on 01/27/2016 9:41:00 AM       EKG won't pull up.  Rate 102 NO acute ischemia.   MDM   Final diagnoses:  None    Patient is a 55 year old female with history of paranoid schizophrenia. Patient is reportedly homeless and often outside a restaurant called [employer]. Today she is picked up by EMS and found to have a fever. Patient has mild wheezing and increased respiratory rate.  Will workup for pneumonia versus UTI versus flu. Lactic sent. Patient unable to give any history.  Suspect psych history the reason why patietn altered, she is speaking in a manner consistent with basleine schizophrenia.  1:09 PM Hypoxia on RA.  Will need to admit for treatment.   Jerzy Roepke Randall An, MD 01/27/16 1320

## 2016-01-28 DIAGNOSIS — Z9119 Patient's noncompliance with other medical treatment and regimen: Secondary | ICD-10-CM

## 2016-01-28 DIAGNOSIS — Z91199 Patient's noncompliance with other medical treatment and regimen due to unspecified reason: Secondary | ICD-10-CM

## 2016-01-28 DIAGNOSIS — F200 Paranoid schizophrenia: Secondary | ICD-10-CM

## 2016-01-28 DIAGNOSIS — J1100 Influenza due to unidentified influenza virus with unspecified type of pneumonia: Secondary | ICD-10-CM

## 2016-01-28 LAB — URINALYSIS, ROUTINE W REFLEX MICROSCOPIC
BILIRUBIN URINE: NEGATIVE
Glucose, UA: NEGATIVE mg/dL
KETONES UR: NEGATIVE mg/dL
LEUKOCYTES UA: NEGATIVE
NITRITE: NEGATIVE
PROTEIN: NEGATIVE mg/dL
Specific Gravity, Urine: 1.019 (ref 1.005–1.030)
pH: 6 (ref 5.0–8.0)

## 2016-01-28 LAB — HIV ANTIBODY (ROUTINE TESTING W REFLEX): HIV Screen 4th Generation wRfx: NONREACTIVE

## 2016-01-28 LAB — BASIC METABOLIC PANEL
Anion gap: 9 (ref 5–15)
BUN: 9 mg/dL (ref 6–20)
CHLORIDE: 106 mmol/L (ref 101–111)
CO2: 24 mmol/L (ref 22–32)
Calcium: 7.7 mg/dL — ABNORMAL LOW (ref 8.9–10.3)
Creatinine, Ser: 0.68 mg/dL (ref 0.44–1.00)
GFR calc Af Amer: >60 mL/min (ref >60)
GFR calc non Af Amer: >60 mL/min (ref >60)
GLUCOSE: 80 mg/dL (ref 65–99)
POTASSIUM: 3.3 mmol/L — AB (ref 3.5–5.1)
SODIUM: 139 mmol/L (ref 135–145)

## 2016-01-28 LAB — RAPID URINE DRUG SCREEN, HOSP PERFORMED
Amphetamines: NOT DETECTED
BARBITURATES: NOT DETECTED
BENZODIAZEPINES: NOT DETECTED
COCAINE: NOT DETECTED
Opiates: NOT DETECTED
Tetrahydrocannabinol: NOT DETECTED

## 2016-01-28 LAB — CBC
HEMATOCRIT: 40.3 % (ref 36.0–46.0)
Hemoglobin: 12.5 g/dL (ref 12.0–15.0)
MCH: 30.3 pg (ref 26.0–34.0)
MCHC: 31 g/dL (ref 30.0–36.0)
MCV: 97.8 fL (ref 78.0–100.0)
Platelets: 144 10*3/uL — ABNORMAL LOW (ref 150–400)
RBC: 4.12 MIL/uL (ref 3.87–5.11)
RDW: 13 % (ref 11.5–15.5)
WBC: 3.9 10*3/uL — AB (ref 4.0–10.5)

## 2016-01-28 LAB — URINE MICROSCOPIC-ADD ON

## 2016-01-28 LAB — STREP PNEUMONIAE URINARY ANTIGEN: Strep Pneumo Urinary Antigen: NEGATIVE

## 2016-01-28 NOTE — Progress Notes (Signed)
[facility] PROGRESS NOTE  [name] WJX:[id number] DOB: April 20, 1961 DOA: 01/27/2016 PCP: No PCP Per Patient  Assessment/Plan: 1. Influenza A -[name] presenting with complaints of cough, fevers, malaise, shortness of breath having flu swab coming back positive for influenza A.  -Continue Tamiflu 75 mg by mouth twice a day -Provide supportive care  2.  Possible community-acquired pneumonia -Initial chest x-ray did not show obvious infiltrate. -She was started on empiric antibiotic coverage with azithromycin and ceftriaxone for possible superimposed bacterial infection  -Blood cultures obtained on 01/27/2016 showing no growth to date  3.  History of schizophrenia -During this hospitalization she was seen and evaluated by Dr. Elsie SaasJonnalagadda  -She was found to have psychotic symptoms. -No recommendations for initiating treatment.  Code Status: Full code Family Communication:  Disposition Plan: Continue supportive care   Consultants:  Psychiatry  Antibiotics:  Ceftriaxone  Azithromycin  Tamiflu  HPI/Subjective: [name] is a pleasant 55 year old female with a past medical history of schizophrenia, presented to the emergency department on 01/27/2016 with complaints of shortness of breath associate with cough and fever. Initial workup included a chest x-ray which did not reveal acute infiltrate. Flu swab came back positive for influenza A and she was started on Tamiflu.  Objective: Filed Vitals:   01/28/16 0229 01/28/16 0708  BP:  146/92  Pulse:  124  Temp: 98.9 F (37.2 C) 99.6 F (37.6 C)  Resp:  18   No intake or output data in the 24 hours ending 01/28/16 1423 Filed Weights   01/27/16 1800  Weight: 87.1 kg (192 lb 0.3 oz)    Exam:   General:  No acute distress, awake and alert  Cardiovascular: Tachycardic, regular rhythm normal S1-S2  Respiratory: Positive by lateral rhonchi, normal respiratory effort  Abdomen: Soft nontender  nondistended  Musculoskeletal: No edema  Data Reviewed: Basic Metabolic Panel:  Recent Labs Lab 01/27/16 1019 01/28/16 0541  NA 134* 139  K 3.6 3.3*  CL 105 106  CO2 22 24  GLUCOSE 100* 80  BUN 10 9  CREATININE 0.67 0.68  CALCIUM 8.5* 7.7*   Liver Function Tests:  Recent Labs Lab 01/27/16 1019  AST 21  ALT 16  ALKPHOS 76  BILITOT 0.7  PROT 7.0  ALBUMIN 3.8   No results for input(s): LIPASE, AMYLASE in the last 168 hours.  Recent Labs Lab 01/27/16 1019  AMMONIA 19   CBC:  Recent Labs Lab 01/27/16 1019 01/28/16 0541  WBC 5.6 3.9*  NEUTROABS 4.6  --   HGB 13.0 12.5  HCT 41.4 40.3  MCV 96.1 97.8  PLT 179 144*   Cardiac Enzymes: No results for input(s): CKTOTAL, CKMB, CKMBINDEX, TROPONINI in the last 168 hours. BNP (last 3 results) No results for input(s): BNP in the last 8760 hours.  ProBNP (last 3 results) No results for input(s): PROBNP in the last 8760 hours.  CBG: No results for input(s): GLUCAP in the last 168 hours.  Recent Results (from the past 240 hour(s))  Culture, blood (routine x 2) Call MD if unable to obtain prior to antibiotics being given     Status: None (Preliminary result)   Collection Time: 01/27/16  2:10 PM  Result Value Ref Range Status   Specimen Description BLOOD LEFT ANTECUBITAL  Final   Special Requests BOTTLES DRAWN AEROBIC AND ANAEROBIC 5CC  Final   Culture   Final    NO GROWTH < 24 HOURS Performed at [facility]     Report Status PENDING  Incomplete  Culture, blood (routine x 2) Call MD if unable to obtain prior to antibiotics being given     Status: None (Preliminary result)   Collection Time: 01/27/16  2:15 PM  Result Value Ref Range Status   Specimen Description BLOOD RIGHT ANTECUBITAL  Final   Special Requests BOTTLES DRAWN AEROBIC AND ANAEROBIC 5CC  Final   Culture   Final    NO GROWTH < 24 HOURS Performed at [facility]    Report Status PENDING  Incomplete  MRSA PCR Screening     Status:  None   Collection Time: 01/27/16  4:55 PM  Result Value Ref Range Status   MRSA by PCR NEGATIVE NEGATIVE Final    Comment:        The GeneXpert MRSA Assay (FDA approved for NASAL specimens only), is one component of a comprehensive MRSA colonization surveillance program. It is not intended to diagnose MRSA infection nor to guide or monitor treatment for MRSA infections.      Studies: Dg Chest 2 View  01/27/2016  CLINICAL DATA:  55 year old female with a history of weakness fever and cough. Lethargy. EXAM: CHEST - 2 VIEW COMPARISON:  08/11/2004 FINDINGS: Cardiomediastinal silhouette unchanged in size and contour. Interstitial opacities with peribronchial thickening. No confluent airspace disease. No pleural effusion. No pneumothorax. No displaced fracture. Unremarkable appearance of the upper abdomen. IMPRESSION: Central airway thickening and interstitial opacities, potentially related to acute or acute on chronic bronchitis. No evidence of lobar pneumonia. Signed, Yvone Neu. Loreta Ave, DO [facility] Radiology Electronically Signed   By: Gilmer Mor D.O.   On: 01/27/2016 10:03    Scheduled Meds: . azithromycin  500 mg Intravenous Q24H  . cefTRIAXone (ROCEPHIN)  IV  1 g Intravenous Q24H  . oseltamivir  75 mg Oral BID   Continuous Infusions: . sodium chloride 50 mL/hr at 01/27/16 1624    Principal Problem:   Paranoid schizophrenia, chronic condition (HCC) Active Problems:   Acute respiratory failure with hypoxia (HCC)   Lobar pneumonia, unspecified organism (HCC)   History of schizophrenia   Non-compliance with treatment    Time spent: 25 min   Jeralyn Bennett  [facility] Pager [phone]. If 7PM-7AM, please contact night-coverage at [email or web address], password [facility] 01/28/2016, 2:23 PM  LOS: 1 day

## 2016-01-28 NOTE — Progress Notes (Signed)
Utilization Review complete 

## 2016-01-28 NOTE — Consult Note (Signed)
[facility] Psychiatry Consult   Reason for Consult:  Chronic paranoid schizophrenia Referring Physician:  Dr. Coralyn Pear Patient Identification: [name] MRN:  [id number] Principal Diagnosis: Paranoid schizophrenia, chronic condition ([place]) Diagnosis:   Patient Active Problem List   Diagnosis Date Noted  . Paranoid schizophrenia, chronic condition ([place]) [F20.0] 01/28/2016  . Non-compliance with treatment [Z91.19] 01/28/2016  . Acute respiratory failure with hypoxia ([place]) [J96.01] 01/27/2016  . Lobar pneumonia, unspecified organism ([place]) [J18.1] 01/27/2016  . History of schizophrenia [Z86.59] 01/27/2016    Total Time spent with patient: 1 hour  Subjective:   [name] is a 55 y.o. female patient admitted with cough and fever.  HPI:  [name] is a 55 years old female admitted to [facility] with cough and fever. Patient presented with generalized weakness, cough and fever. She is seen face to face psychiatric consultaion and evaluation for psych medication management and case discussed with Dr. Coralyn Pear. Patient has history of chronic paranoid psychosis and non compliant with medication treatment. She is a poor historian and does not answer questions consistently. She is always wears dark glasses both in side and out side of the buildings to minimize her paranoid thought and ideations. Patient does not wants to address her psychiatric treatment as she was doing fine , not treated over two years and last [facility] hospitalization was five years ago. Patient does not have active psychosis and endorses chronic paranoid thoughts. Patient gently denied psychiatric medication management and seeks only medical care during this stay. Patient has no suicide or homicide ideation, intention or plans. She denied auditory and visual hallucinations, denied agitation or aggression to self or others and has no danger to her self and others, so can't force psychiatric medication management  and don't recommend IVC.   Patient may be provided information about [facility] if she changes her mind regarding psych medication treatment.   Past Psychiatric History: [facility] admission in July 2012.  Risk to Self: Is patient at risk for suicide?: No Risk to Others:   Prior Inpatient Therapy:   Prior Outpatient Therapy:    Past Medical History:  Past Medical History  Diagnosis Date  . Paranoid schizophrenia ([place])    History reviewed. No pertinent past surgical history. Family History: No family history on file. Family Psychiatric  History: unknown Social History:  History  Alcohol Use  . Yes     History  Drug Use No    Social History   Social History  . Marital Status: Single    Spouse Name: N/A  . Number of Children: N/A  . Years of Education: N/A   Social History Main Topics  . Smoking status: Current Every Day Smoker -- 0.50 packs/day    Types: Cigarettes  . Smokeless tobacco: None  . Alcohol Use: Yes  . Drug Use: No  . Sexual Activity: Not Asked   Other Topics Concern  . None   Social History Narrative   Additional Social History:    Allergies:   Allergies  Allergen Reactions  . Codeine Other (See Comments)    Makes her sleepy    Labs:  Results for orders placed or performed during the hospital encounter of 01/27/16 (from the past 48 hour(s))  CBC with Differential/Platelet     Status: Abnormal   Collection Time: 01/27/16 10:19 AM  Result Value Ref Range   WBC 5.6 4.0 - 10.5 K/uL   RBC 4.31 3.87 - 5.11 MIL/uL   Hemoglobin 13.0 12.0 -  15.0 g/dL   HCT 41.4 36.0 - 46.0 %   MCV 96.1 78.0 - 100.0 fL   MCH 30.2 26.0 - 34.0 pg   MCHC 31.4 30.0 - 36.0 g/dL   RDW 12.7 11.5 - 15.5 %   Platelets 179 150 - 400 K/uL   Neutrophils Relative % 81 %   Neutro Abs 4.6 1.7 - 7.7 K/uL   Lymphocytes Relative 7 %   Lymphs Abs 0.4 (L) 0.7 - 4.0 K/uL   Monocytes Relative 12 %   Monocytes Absolute 0.7 0.1 - 1.0 K/uL   Eosinophils Relative 0 %    Eosinophils Absolute 0.0 0.0 - 0.7 K/uL   Basophils Relative 0 %   Basophils Absolute 0.0 0.0 - 0.1 K/uL  Comprehensive metabolic panel     Status: Abnormal   Collection Time: 01/27/16 10:19 AM  Result Value Ref Range   Sodium 134 (L) 135 - 145 mmol/L   Potassium 3.6 3.5 - 5.1 mmol/L   Chloride 105 101 - 111 mmol/L   CO2 22 22 - 32 mmol/L   Glucose, Bld 100 (H) 65 - 99 mg/dL   BUN 10 6 - 20 mg/dL   Creatinine, Ser 0.67 0.44 - 1.00 mg/dL   Calcium 8.5 (L) 8.9 - 10.3 mg/dL   Total Protein 7.0 6.5 - 8.1 g/dL   Albumin 3.8 3.5 - 5.0 g/dL   AST 21 15 - 41 U/L   ALT 16 14 - 54 U/L   Alkaline Phosphatase 76 38 - 126 U/L   Total Bilirubin 0.7 0.3 - 1.2 mg/dL   GFR calc non Af Amer >60 >60 mL/min   GFR calc Af Amer >60 >60 mL/min    Comment: (NOTE) The eGFR has been calculated using the CKD EPI equation. This calculation has not been validated in all clinical situations. eGFR's persistently <60 mL/min signify possible Chronic Kidney Disease.    Anion gap 7 5 - 15  Protime-INR     Status: None   Collection Time: 01/27/16 10:19 AM  Result Value Ref Range   Prothrombin Time 14.9 11.6 - 15.2 seconds   INR 1.15 0.00 - 1.49  Ammonia     Status: None   Collection Time: 01/27/16 10:19 AM  Result Value Ref Range   Ammonia 19 9 - 35 umol/L  I-stat troponin, ED     Status: None   Collection Time: 01/27/16 10:24 AM  Result Value Ref Range   Troponin i, poc 0.03 0.00 - 0.08 ng/mL   Comment 3            Comment: Due to the release kinetics of cTnI, a negative result within the first hours of the onset of symptoms does not rule out myocardial infarction with certainty. If myocardial infarction is still suspected, repeat the test at appropriate intervals.   I-Stat CG4 Lactic Acid, ED     Status: None   Collection Time: 01/27/16 10:26 AM  Result Value Ref Range   Lactic Acid, Venous 0.53 0.5 - 2.0 mmol/L  Influenza panel by PCR (type A & B, H1N1)     Status: Abnormal   Collection Time:  01/27/16 11:35 AM  Result Value Ref Range   Influenza A By PCR POSITIVE (A) NEGATIVE   Influenza B By PCR NEGATIVE NEGATIVE   H1N1 flu by pcr NOT DETECTED NOT DETECTED    Comment:        The Xpert Flu assay (FDA approved for nasal aspirates or washes and nasopharyngeal swab specimens),  is intended as an aid in the diagnosis of influenza and should not be used as a sole basis for treatment. Performed at [facility]   Ethanol     Status: None   Collection Time: 01/27/16  1:45 PM  Result Value Ref Range   Alcohol, Ethyl (B) <5 <5 mg/dL    Comment:        LOWEST DETECTABLE LIMIT FOR SERUM ALCOHOL IS 5 mg/dL FOR MEDICAL PURPOSES ONLY   I-Stat CG4 Lactic Acid, ED     Status: Abnormal   Collection Time: 01/27/16  1:58 PM  Result Value Ref Range   Lactic Acid, Venous 0.41 (L) 0.5 - 2.0 mmol/L  HIV antibody     Status: None   Collection Time: 01/27/16  2:10 PM  Result Value Ref Range   HIV Screen 4th Generation wRfx Non Reactive Non Reactive    Comment: (NOTE) Performed At: [facility] [place], [place] [id number] Lindon Romp MD BS:[id number]   Culture, blood (routine x 2) Call MD if unable to obtain prior to antibiotics being given     Status: None (Preliminary result)   Collection Time: 01/27/16  2:10 PM  Result Value Ref Range   Specimen Description BLOOD LEFT ANTECUBITAL    Special Requests BOTTLES DRAWN AEROBIC AND ANAEROBIC 5CC    Culture PENDING    Report Status PENDING   Culture, blood (routine x 2) Call MD if unable to obtain prior to antibiotics being given     Status: None (Preliminary result)   Collection Time: 01/27/16  2:15 PM  Result Value Ref Range   Specimen Description BLOOD RIGHT ANTECUBITAL    Special Requests BOTTLES DRAWN AEROBIC AND ANAEROBIC 5CC    Culture PENDING    Report Status PENDING   MRSA PCR Screening     Status: None   Collection Time: 01/27/16  4:55 PM  Result Value Ref Range   MRSA by PCR NEGATIVE  NEGATIVE    Comment:        The GeneXpert MRSA Assay (FDA approved for NASAL specimens only), is one component of a comprehensive MRSA colonization surveillance program. It is not intended to diagnose MRSA infection nor to guide or monitor treatment for MRSA infections.   Basic metabolic panel     Status: Abnormal   Collection Time: 01/28/16  5:41 AM  Result Value Ref Range   Sodium 139 135 - 145 mmol/L   Potassium 3.3 (L) 3.5 - 5.1 mmol/L   Chloride 106 101 - 111 mmol/L   CO2 24 22 - 32 mmol/L   Glucose, Bld 80 65 - 99 mg/dL   BUN 9 6 - 20 mg/dL   Creatinine, Ser 0.68 0.44 - 1.00 mg/dL   Calcium 7.7 (L) 8.9 - 10.3 mg/dL   GFR calc non Af Amer >60 >60 mL/min   GFR calc Af Amer >60 >60 mL/min    Comment: (NOTE) The eGFR has been calculated using the CKD EPI equation. This calculation has not been validated in all clinical situations. eGFR's persistently <60 mL/min signify possible Chronic Kidney Disease.    Anion gap 9 5 - 15  CBC     Status: Abnormal   Collection Time: 01/28/16  5:41 AM  Result Value Ref Range   WBC 3.9 (L) 4.0 - 10.5 K/uL   RBC 4.12 3.87 - 5.11 MIL/uL   Hemoglobin 12.5 12.0 - 15.0 g/dL   HCT 40.3 36.0 - 46.0 %   MCV 97.8 78.0 -  100.0 fL   MCH 30.3 26.0 - 34.0 pg   MCHC 31.0 30.0 - 36.0 g/dL   RDW 13.0 11.5 - 15.5 %   Platelets 144 (L) 150 - 400 K/uL    Current Facility-Administered Medications  Medication Dose Route Frequency Provider Last Rate Last Dose  . 0.9 %  sodium chloride infusion   Intravenous Continuous Robbie Lis, MD 50 mL/hr at 01/27/16 1624    . acetaminophen (TYLENOL) tablet 650 mg  650 mg Oral Q4H PRN Jeryl Columbia, NP   650 mg at 01/28/16 0728  . azithromycin (ZITHROMAX) 500 mg in dextrose 5 % 250 mL IVPB  500 mg Intravenous Q24H Minda Ditto, RPH      . cefTRIAXone (ROCEPHIN) 1 g in dextrose 5 % 50 mL IVPB  1 g Intravenous Q24H Minda Ditto, RPH   1 g at 01/27/16 2002  . ipratropium-albuterol (DUONEB) 0.5-2.5 (3) MG/3ML  nebulizer solution 3 mL  3 mL Nebulization Q4H PRN Robbie Lis, MD      . oseltamivir (TAMIFLU) capsule 75 mg  75 mg Oral BID Jeryl Columbia, NP   75 mg at 01/27/16 2224    Musculoskeletal: Strength & Muscle Tone: decreased Gait & Station: unable to stand Patient leans: N/A  Psychiatric Specialty Exam: ROS  Cough, generalized weakness, denied nausea, vomiting and chest pain No Fever-chills, No Headache, No changes with Vision or hearing, reports vertigo No problems swallowing food or Liquids, No Chest pain, Cough or Shortness of Breath, No Abdominal pain, No Nausea or Vommitting, Bowel movements are regular, No Blood in stool or Urine, No dysuria, No new skin rashes or bruises, No new joints pains-aches,  No new weakness, tingling, numbness in any extremity, No recent weight gain or loss, No polyuria, polydypsia or polyphagia,   A full 10 point Review of Systems was done, except as stated above, all other Review of Systems were negative.  Blood pressure 146/92, pulse 124, temperature 99.6 F (37.6 C), temperature source Oral, resp. rate 18, height _0  (1.626 m), weight 87.1 kg (192 lb 0.3 oz), SpO2 100 %.Body mass index is 32.94 kg/(m^2).  General Appearance: Guarded  Eye Contact::  Fair  Speech:  Clear and Coherent and Slow  Volume:  Decreased  Mood:  Euthymic  Affect:  Constricted  Thought Process:  Coherent  Orientation:  Full (Time, Place, and Person)  Thought Content:  Rumination  Suicidal Thoughts:  No  Homicidal Thoughts:  No  Memory:  Immediate;   Fair Recent;   Fair  Judgement:  Fair  Insight:  Fair  Psychomotor Activity:  Decreased  Concentration:  Fair  Recall:  [employer] of Knowledge:Fair  Language: Fair  Akathisia:  Negative  Handed:  Right  AIMS (if indicated):     Assets:  Communication Skills Desire for Improvement Financial Resources/Insurance Leisure Time Resilience  ADL's:  Intact  Cognition: WNL  Sleep:      Treatment Plan  Summary: Patient presented with generalized weakness, cough and fever. Patient has history of chronic paranoid psychosis and non compliant with medication treatment. Patient does not wants to address her psychiatric treatment as she was not treated over two years and last [facility] hospitalization was five years ago.  Patient does not have active psychosis and endorses chronic paranoid thoughts.   Patient gently denied psychiatric medication management and seeks only medical care during this stay.   Patient has no suicide or homicide ideation, intention or plans. She denied auditory  and visual hallucinations and has no danger to her self and others, so can't force psychiatric medication management.   Patient may be provided information about [facility] clinic if she changes her mind regarding psych medication treatment.   Appreciate psychiatric consultation and we sign off as of today Please contact [phone] or [phone] if needs further assistance   Disposition: Patient does not meet criteria for psychiatric inpatient admission.  Durward Parcel., MD 01/28/2016 10:33 AM

## 2016-01-29 DIAGNOSIS — J111 Influenza due to unidentified influenza virus with other respiratory manifestations: Secondary | ICD-10-CM

## 2016-01-29 MED ORDER — HYDRALAZINE HCL 20 MG/ML IJ SOLN
10.0000 mg | Freq: Four times a day (QID) | INTRAMUSCULAR | Status: DC | PRN
  Administered 2016-01-29 – 2016-01-30 (×2): 10 mg via INTRAVENOUS
  Filled 2016-01-29 (×2): qty 1

## 2016-01-29 MED ORDER — POTASSIUM CHLORIDE CRYS ER 20 MEQ PO TBCR
40.0000 meq | EXTENDED_RELEASE_TABLET | Freq: Once | ORAL | Status: AC
  Administered 2016-01-29: 40 meq via ORAL
  Filled 2016-01-29: qty 2

## 2016-01-29 MED ORDER — OXYCODONE HCL 5 MG PO TABS
5.0000 mg | ORAL_TABLET | Freq: Four times a day (QID) | ORAL | Status: DC | PRN
  Administered 2016-01-29 – 2016-02-02 (×6): 5 mg via ORAL
  Filled 2016-01-29 (×8): qty 1

## 2016-01-29 MED ORDER — ONDANSETRON HCL 4 MG/2ML IJ SOLN
4.0000 mg | Freq: Four times a day (QID) | INTRAMUSCULAR | Status: DC | PRN
  Administered 2016-01-29: 4 mg via INTRAVENOUS
  Filled 2016-01-29: qty 2

## 2016-01-29 NOTE — Progress Notes (Signed)
Patient has signed an authorization letter for disclosure of protected health information (to give information to her brother, Nile Dearimothy Symonette).

## 2016-01-29 NOTE — Progress Notes (Signed)
[facility] PROGRESS NOTE  Jasmine MaidensSherry Nguyen ZOX:[id number] DOB: [birth year] DOA: 01/27/2016 PCP: No PCP Per Patient  Assessment/Plan: 1. Influenza A -[name] presenting with complaints of cough, fevers, malaise, shortness of breath having flu swab coming back positive for influenza A.  -Continue Tamiflu 75 mg by mouth twice a day -Showing some clinical improvement today.  2.  Possible community-acquired pneumonia -Initial chest x-ray did not show obvious infiltrate. -She was started on empiric antibiotic coverage with azithromycin and ceftriaxone for possible superimposed bacterial infection  -Blood cultures obtained on 01/27/2016 showing no growth to date  3.  History of schizophrenia -During this hospitalization she was seen and evaluated by Dr. Elsie SaasJonnalagadda  -She was found to have psychotic symptoms. -No recommendations for initiating treatment from psychiatry. -I spoke to her brother on 01/29/2016 who expressed concerns over her psychiatric history. This was relayed to Dr. Elsie SaasJonnalagadda of psychiatry. Did not recommend treatment against her will.  Code Status: Full code Family Communication: I spoke to her brother today who expressed concerns over her psychiatric history. Disposition Plan: Continue supportive care   Consultants:  Psychiatry  Antibiotics:  Ceftriaxone  Azithromycin  Tamiflu  HPI/Subjective: [name] is a pleasant 55 year old female with a past medical history of schizophrenia, presented to the emergency department on 01/27/2016 with complaints of shortness of breath associate with cough and fever. Initial workup included a chest x-ray which did not reveal acute infiltrate. Flu swab came back positive for influenza A and she was started on Tamiflu.  Objective: Filed Vitals:   01/29/16 0444 01/29/16 1408  BP: 128/59 162/82  Pulse: 75 81  Temp: 98.8 F (37.1 C) 98.7 F (37.1 C)  Resp: 20 20    Intake/Output Summary (Last 24 hours) at 01/29/16  1415 Last data filed at 01/29/16 1410  Gross per 24 hour  Intake 2341.67 ml  Output    600 ml  Net 1741.67 ml   Filed Weights   01/27/16 1800  Weight: 87.1 kg (192 lb 0.3 oz)    Exam:   General:  No acute distress, awake and alert, having a flat affect, minimally verbal.  Cardiovascular: Tachycardic, regular rhythm normal S1-S2  Respiratory: Positive by lateral rhonchi, normal respiratory effort  Abdomen: Soft nontender nondistended  Musculoskeletal: No edema  Data Reviewed: Basic Metabolic Panel:  Recent Labs Lab 01/27/16 1019 01/28/16 0541  NA 134* 139  K 3.6 3.3*  CL 105 106  CO2 22 24  GLUCOSE 100* 80  BUN 10 9  CREATININE 0.67 0.68  CALCIUM 8.5* 7.7*   Liver Function Tests:  Recent Labs Lab 01/27/16 1019  AST 21  ALT 16  ALKPHOS 76  BILITOT 0.7  PROT 7.0  ALBUMIN 3.8   No results for input(s): LIPASE, AMYLASE in the last 168 hours.  Recent Labs Lab 01/27/16 1019  AMMONIA 19   CBC:  Recent Labs Lab 01/27/16 1019 01/28/16 0541  WBC 5.6 3.9*  NEUTROABS 4.6  --   HGB 13.0 12.5  HCT 41.4 40.3  MCV 96.1 97.8  PLT 179 144*   Cardiac Enzymes: No results for input(s): CKTOTAL, CKMB, CKMBINDEX, TROPONINI in the last 168 hours. BNP (last 3 results) No results for input(s): BNP in the last 8760 hours.  ProBNP (last 3 results) No results for input(s): PROBNP in the last 8760 hours.  CBG: No results for input(s): GLUCAP in the last 168 hours.  Recent Results (from the past 240 hour(s))  Culture, blood (routine x 2) Call MD if unable to  obtain prior to antibiotics being given     Status: None (Preliminary result)   Collection Time: 01/27/16  2:10 PM  Result Value Ref Range Status   Specimen Description BLOOD LEFT ANTECUBITAL  Final   Special Requests BOTTLES DRAWN AEROBIC AND ANAEROBIC 5CC  Final   Culture   Final    NO GROWTH < 24 HOURS Performed at [facility]    Report Status PENDING  Incomplete  Culture, blood (routine x  2) Call MD if unable to obtain prior to antibiotics being given     Status: None (Preliminary result)   Collection Time: 01/27/16  2:15 PM  Result Value Ref Range Status   Specimen Description BLOOD RIGHT ANTECUBITAL  Final   Special Requests BOTTLES DRAWN AEROBIC AND ANAEROBIC 5CC  Final   Culture   Final    NO GROWTH < 24 HOURS Performed at [facility]    Report Status PENDING  Incomplete  MRSA PCR Screening     Status: None   Collection Time: 01/27/16  4:55 PM  Result Value Ref Range Status   MRSA by PCR NEGATIVE NEGATIVE Final    Comment:        The GeneXpert MRSA Assay (FDA approved for NASAL specimens only), is one component of a comprehensive MRSA colonization surveillance program. It is not intended to diagnose MRSA infection nor to guide or monitor treatment for MRSA infections.      Studies: No results found.  Scheduled Meds: . azithromycin  500 mg Intravenous Q24H  . cefTRIAXone (ROCEPHIN)  IV  1 g Intravenous Q24H  . oseltamivir  75 mg Oral BID   Continuous Infusions: . sodium chloride 50 mL/hr at 01/27/16 1624    Principal Problem:   Paranoid schizophrenia, chronic condition ([facility]) Active Problems:   Acute respiratory failure with hypoxia ([facility])   Lobar pneumonia, unspecified organism ([facility])   History of schizophrenia   Non-compliance with treatment    Time spent: 25 min   Jeralyn Bennett  [facility] Pager [phone]. If 7PM-7AM, please contact night-coverage at [email or web address], password [facility] [place] 01/29/2016, 2:15 PM  LOS: 2 days

## 2016-01-29 NOTE — Progress Notes (Signed)
Pt confirmed and states it is ok to talk with [name]. [name] called to speak with MD concerning his sister the pt's mental state, pt is homeless, does not take her medication or follow up with MD's visits. [name] seem very concern about pt and continue to state, "My mother and I have tried to help her for years. We may not see her for 6 months and will not know where she is."  [name]'s contact number, [phone]. Referred timothy to [facility] [phone].

## 2016-01-30 ENCOUNTER — Inpatient Hospital Stay (HOSPITAL_COMMUNITY): Payer: Medicare Other

## 2016-01-30 LAB — CBC
HEMATOCRIT: 40.5 % (ref 36.0–46.0)
Hemoglobin: 12.7 g/dL (ref 12.0–15.0)
MCH: 30.1 pg (ref 26.0–34.0)
MCHC: 31.4 g/dL (ref 30.0–36.0)
MCV: 96 fL (ref 78.0–100.0)
Platelets: 160 10*3/uL (ref 150–400)
RBC: 4.22 MIL/uL (ref 3.87–5.11)
RDW: 12.8 % (ref 11.5–15.5)
WBC: 3.8 10*3/uL — ABNORMAL LOW (ref 4.0–10.5)

## 2016-01-30 LAB — BASIC METABOLIC PANEL
Anion gap: 9 (ref 5–15)
BUN: 7 mg/dL (ref 6–20)
CHLORIDE: 105 mmol/L (ref 101–111)
CO2: 27 mmol/L (ref 22–32)
Calcium: 8.1 mg/dL — ABNORMAL LOW (ref 8.9–10.3)
Creatinine, Ser: 0.61 mg/dL (ref 0.44–1.00)
GFR calc Af Amer: >60 mL/min (ref >60)
GFR calc non Af Amer: >60 mL/min (ref >60)
GLUCOSE: 102 mg/dL — AB (ref 65–99)
Potassium: 3.6 mmol/L (ref 3.5–5.1)
Sodium: 141 mmol/L (ref 135–145)

## 2016-01-30 MED ORDER — AMLODIPINE BESYLATE 10 MG PO TABS
10.0000 mg | ORAL_TABLET | Freq: Every day | ORAL | Status: DC
  Administered 2016-01-30 – 2016-02-04 (×6): 10 mg via ORAL
  Filled 2016-01-30 (×6): qty 1

## 2016-01-30 MED ORDER — DOXYCYCLINE HYCLATE 100 MG PO TABS
100.0000 mg | ORAL_TABLET | Freq: Two times a day (BID) | ORAL | Status: DC
  Administered 2016-01-30 – 2016-02-03 (×8): 100 mg via ORAL
  Filled 2016-01-30 (×9): qty 1

## 2016-01-30 NOTE — Clinical Social Work Note (Signed)
CSW received call from patient's brother to call him regarding patient. Visited with patient but could not converse with her as speech was disjointed and rambling. She did ask CSW to call her brother. Call made to Nile Dearimothy Grochowski [phone]) regarding patient. Family is very concerned about patient due to her mental health issues and chronic homelessness and this was discussed. Per Mr. Raul Dellston, they have not seen patient in months and out of the blue he may get a phone call. Brother reported that his sister does receive a disability check but cannot keep housing. CSW and brother discussed options as guardianship and being payee for patient's check. Mr. Raul Dellston reported that their mother was payee for patient's check at one time but discontinued it due to patient's behavior. CSW suggested that he may want to talk with an attorney regarding guardianship and brother will think about this. Mr. Raul Dellston thanked CSW for her time and talking with him regarding their concerns.  Genelle BalVanessa Parissa Chiao, MSW, LCSW Licensed Clinical Social Worker Clinical Social Work Department [facility] [phone]

## 2016-01-30 NOTE — Progress Notes (Addendum)
[facility] PROGRESS NOTE  Jasmine MaidensSherry Nguyen NGE:[id number] DOB: [birth year] DOA: 01/27/2016 PCP: No PCP Per Patient  Assessment/Plan: 1. Influenza A -[name] presenting with complaints of cough, fevers, malaise, shortness of breath having flu swab coming back positive for influenza A.  -Continue Tamiflu 75 mg by mouth twice a day -Showing some clinical improvement today.  2.  Possible community-acquired pneumonia -Initial chest x-ray did not show obvious infiltrate. -She was started on empiric antibiotic coverage with azithromycin and ceftriaxone for possible superimposed bacterial infection  -Blood cultures obtained on 01/27/2016 showing no growth to date -She was transitioned to doxy on 01/30/2016 given clinical improvement   3.  History of schizophrenia -During this hospitalization she was seen and evaluated by Dr. Elsie SaasJonnalagadda  -She was found to have psychotic symptoms. -No recommendations for initiating treatment from psychiatry. -I spoke to her brother on 01/29/2016 who expressed concerns over her psychiatric history. This was relayed to Dr. Elsie SaasJonnalagadda of psychiatry. Did not recommend treatment against her will.  4.  Hip Pain -She complains of ongoing hip pain involving right hip -Denies recent falls or trauma -Will check an X-ray of hip  5.  Hypertension -Blood pressures remain elevated, SBP's in the 160's to 180's -Will start Norvasc 10 mg PO q daily  Code Status: Full code Family Communication: I spoke to her brother today who expressed concerns over her psychiatric history. Disposition Plan: Anticipate discharge in the next 24 hours.    Consultants:  Psychiatry  Antibiotics:  Ceftriaxone  Azithromycin  Tamiflu  HPI/Subjective: [name] is a pleasant [age] year old female with a past medical history of schizophrenia, presented to the emergency department on 01/27/2016 with complaints of shortness of breath associate with cough and fever. Initial workup  included a chest x-ray which did not reveal acute infiltrate. Flu swab came back positive for influenza A and she was started on Tamiflu.  Objective: Filed Vitals:   01/29/16 2250 01/30/16 0500  BP: 150/88 159/85  Pulse: 96 91  Temp: 98.1 F (36.7 C) 98.3 F (36.8 C)  Resp: 16 16    Intake/Output Summary (Last 24 hours) at 01/30/16 1359 Last data filed at 01/29/16 2250  Gross per 24 hour  Intake 650.83 ml  Output    400 ml  Net 250.83 ml   Filed Weights   01/27/16 1800  Weight: 87.1 kg (192 lb 0.3 oz)    Exam:   General:  No acute distress, awake and alert, having a flat affect, minimally verbal.  Cardiovascular: Tachycardic, regular rhythm normal S1-S2  Respiratory: Positive by lateral rhonchi, normal respiratory effort  Abdomen: Soft nontender nondistended  Musculoskeletal: No edema  Data Reviewed: Basic Metabolic Panel:  Recent Labs Lab 01/27/16 1019 01/28/16 0541 01/30/16 0610  NA 134* 139 141  K 3.6 3.3* 3.6  CL 105 106 105  CO2 22 24 27   GLUCOSE 100* 80 102*  BUN 10 9 7   CREATININE 0.67 0.68 0.61  CALCIUM 8.5* 7.7* 8.1*   Liver Function Tests:  Recent Labs Lab 01/27/16 1019  AST 21  ALT 16  ALKPHOS 76  BILITOT 0.7  PROT 7.0  ALBUMIN 3.8   No results for input(s): LIPASE, AMYLASE in the last 168 hours.  Recent Labs Lab 01/27/16 1019  AMMONIA 19   CBC:  Recent Labs Lab 01/27/16 1019 01/28/16 0541 01/30/16 0610  WBC 5.6 3.9* 3.8*  NEUTROABS 4.6  --   --   HGB 13.0 12.5 12.7  HCT 41.4 40.3 40.5  MCV 96.1 97.8  96.0  PLT 179 144* 160   Cardiac Enzymes: No results for input(s): CKTOTAL, CKMB, CKMBINDEX, TROPONINI in the last 168 hours. BNP (last 3 results) No results for input(s): BNP in the last 8760 hours.  ProBNP (last 3 results) No results for input(s): PROBNP in the last 8760 hours.  CBG: No results for input(s): GLUCAP in the last 168 hours.  Recent Results (from the past 240 hour(s))  Culture, blood (routine x  2) Call MD if unable to obtain prior to antibiotics being given     Status: None (Preliminary result)   Collection Time: 01/27/16  2:10 PM  Result Value Ref Range Status   Specimen Description BLOOD LEFT ANTECUBITAL  Final   Special Requests BOTTLES DRAWN AEROBIC AND ANAEROBIC 5CC  Final   Culture   Final    NO GROWTH 2 DAYS Performed at [facility]    Report Status PENDING  Incomplete  Culture, blood (routine x 2) Call MD if unable to obtain prior to antibiotics being given     Status: None (Preliminary result)   Collection Time: 01/27/16  2:15 PM  Result Value Ref Range Status   Specimen Description BLOOD RIGHT ANTECUBITAL  Final   Special Requests BOTTLES DRAWN AEROBIC AND ANAEROBIC 5CC  Final   Culture   Final    NO GROWTH 2 DAYS Performed at [facility]    Report Status PENDING  Incomplete  MRSA PCR Screening     Status: None   Collection Time: 01/27/16  4:55 PM  Result Value Ref Range Status   MRSA by PCR NEGATIVE NEGATIVE Final    Comment:        The GeneXpert MRSA Assay (FDA approved for NASAL specimens only), is one component of a comprehensive MRSA colonization surveillance program. It is not intended to diagnose MRSA infection nor to guide or monitor treatment for MRSA infections.      Studies: No results found.  Scheduled Meds: . azithromycin  500 mg Intravenous Q24H  . cefTRIAXone (ROCEPHIN)  IV  1 g Intravenous Q24H  . oseltamivir  75 mg Oral BID   Continuous Infusions:    Principal Problem:   Paranoid schizophrenia, chronic condition (HCC) Active Problems:   Acute respiratory failure with hypoxia (HCC)   Lobar pneumonia, unspecified organism (HCC)   History of schizophrenia   Non-compliance with treatment    Time spent: 25 min   Jeralyn Bennett  [facility] Pager [phone]. If 7PM-7AM, please contact night-coverage at [email or web address], password [facility] 01/30/2016, 1:59 PM  LOS: 3 days

## 2016-01-31 DIAGNOSIS — J09X1 Influenza due to identified novel influenza A virus with pneumonia: Secondary | ICD-10-CM

## 2016-01-31 LAB — CBC
HCT: 39.5 % (ref 36.0–46.0)
HEMOGLOBIN: 12.8 g/dL (ref 12.0–15.0)
MCH: 29.4 pg (ref 26.0–34.0)
MCHC: 32.4 g/dL (ref 30.0–36.0)
MCV: 90.6 fL (ref 78.0–100.0)
Platelets: 169 10*3/uL (ref 150–400)
RBC: 4.36 MIL/uL (ref 3.87–5.11)
RDW: 12.4 % (ref 11.5–15.5)
WBC: 3.3 10*3/uL — ABNORMAL LOW (ref 4.0–10.5)

## 2016-01-31 LAB — BASIC METABOLIC PANEL
ANION GAP: 10 (ref 5–15)
BUN: 6 mg/dL (ref 6–20)
CALCIUM: 8.5 mg/dL — AB (ref 8.9–10.3)
CHLORIDE: 105 mmol/L (ref 101–111)
CO2: 27 mmol/L (ref 22–32)
CREATININE: 0.56 mg/dL (ref 0.44–1.00)
GFR calc Af Amer: >60 mL/min (ref >60)
GFR calc non Af Amer: >60 mL/min (ref >60)
GLUCOSE: 102 mg/dL — AB (ref 65–99)
Potassium: 3.5 mmol/L (ref 3.5–5.1)
Sodium: 142 mmol/L (ref 135–145)

## 2016-01-31 LAB — GLUCOSE, CAPILLARY: GLUCOSE-CAPILLARY: 93 mg/dL (ref 65–99)

## 2016-01-31 MED ORDER — ISOSORBIDE MONONITRATE ER 30 MG PO TB24
30.0000 mg | ORAL_TABLET | Freq: Every day | ORAL | Status: DC
  Administered 2016-01-31 – 2016-02-04 (×5): 30 mg via ORAL
  Filled 2016-01-31 (×5): qty 1

## 2016-01-31 MED ORDER — MUSCLE RUB 10-15 % EX CREA
TOPICAL_CREAM | CUTANEOUS | Status: DC | PRN
  Filled 2016-01-31: qty 85

## 2016-01-31 NOTE — Progress Notes (Signed)
[facility] PROGRESS NOTE  [name] WUJ:[id number] DOB: [birth year] DOA: 01/27/2016 PCP: No PCP Per Patient  Assessment/Plan: 1. Influenza A -[name] presenting with complaints of cough, fevers, malaise, shortness of breath having flu swab coming back positive for influenza A.  -Continue Tamiflu 75 mg by mouth twice a day -Showing some clinical improvement today.I think from a medical perspective she will be ready for discharge in the next 24 hours.   2.  Possible community-acquired pneumonia -Initial chest x-ray did not show obvious infiltrate. -She was started on empiric antibiotic coverage with azithromycin and ceftriaxone for possible superimposed bacterial infection  -Blood cultures obtained on 01/27/2016 showing no growth to date -She was transitioned to doxy on 01/30/2016 given clinical improvement   3.  History of schizophrenia -During this hospitalization she was seen and evaluated by Dr. Elsie SaasJonnalagadda  -She was found to have psychotic symptoms. -No recommendations for initiating treatment from psychiatry. -I spoke to her brother on 01/29/2016 who expressed concerns over her psychiatric history. This was relayed to Dr. Elsie SaasJonnalagadda of psychiatry. Did not recommend treatment against her will.  4.  Hip Pain likely secondary to OA -She complains of ongoing hip pain involving right hip -Denies recent falls or trauma -XRay showed degenerative changes.  -continue medical management  5.  Hypertension -Blood pressures remain elevated, SBP's in the 160's to 180's -Start Norvasc 10 mg PO q daily, blood pressures remain elevated -Imdur 30 mg PO q daily added on 01/31/2016  Code Status: Full code Family Communication: I spoke to her brother today who expressed concerns over her psychiatric history. Disposition Plan: Anticipate discharge in the next 24 hours.    Consultants:  Psychiatry  Antibiotics:  Ceftriaxone  Azithromycin  Tamiflu  HPI/Subjective: Jasmine  [name] is a pleasant 55 year old female with a past medical history of schizophrenia, presented to the emergency department on 01/27/2016 with complaints of shortness of breath associate with cough and fever. Initial workup included a chest x-ray which did not reveal acute infiltrate. Flu swab came back positive for influenza A and she was started on Tamiflu.  Objective: Filed Vitals:   01/30/16 2120 01/31/16 0511  BP: 162/94 164/93  Pulse: 80 80  Temp: 98.7 F (37.1 C) 98.4 F (36.9 C)  Resp: 18 16    Intake/Output Summary (Last 24 hours) at 01/31/16 1347 Last data filed at 01/31/16 [id number]  Gross per 24 hour  Intake    480 ml  Output      0 ml  Net    480 ml   Filed Weights   01/27/16 1800  Weight: 87.1 kg (192 lb 0.3 oz)    Exam:   General:  No acute distress, awake and alert, having a flat affect, minimally verbal.  Cardiovascular: Tachycardic, regular rhythm normal S1-S2  Respiratory: Positive by lateral rhonchi, normal respiratory effort  Abdomen: Soft nontender nondistended  Musculoskeletal: No edema  Data Reviewed: Basic Metabolic Panel:  Recent Labs Lab 01/27/16 1019 01/28/16 0541 01/30/16 0610 01/31/16 0626  NA 134* 139 141 142  K 3.6 3.3* 3.6 3.5  CL 105 106 105 105  CO2 22 24 27 27   GLUCOSE 100* 80 102* 102*  BUN 10 9 7 6   CREATININE 0.67 0.68 0.61 0.56  CALCIUM 8.5* 7.7* 8.1* 8.5*   Liver Function Tests:  Recent Labs Lab 01/27/16 1019  AST 21  ALT 16  ALKPHOS 76  BILITOT 0.7  PROT 7.0  ALBUMIN 3.8   No results for input(s): LIPASE, AMYLASE in the  last 168 hours.  Recent Labs Lab 01/27/16 1019  AMMONIA 19   CBC:  Recent Labs Lab 01/27/16 1019 01/28/16 0541 01/30/16 0610 01/31/16 0626  WBC 5.6 3.9* 3.8* 3.3*  NEUTROABS 4.6  --   --   --   HGB 13.0 12.5 12.7 12.8  HCT 41.4 40.3 40.5 39.5  MCV 96.1 97.8 96.0 90.6  PLT 179 144* 160 169   Cardiac Enzymes: No results for input(s): CKTOTAL, CKMB, CKMBINDEX, TROPONINI in the  last 168 hours. BNP (last 3 results) No results for input(s): BNP in the last 8760 hours.  ProBNP (last 3 results) No results for input(s): PROBNP in the last 8760 hours.  CBG:  Recent Labs Lab 01/31/16 0756  GLUCAP 93    Recent Results (from the past 240 hour(s))  Culture, blood (routine x 2) Call MD if unable to obtain prior to antibiotics being given     Status: None (Preliminary result)   Collection Time: 01/27/16  2:10 PM  Result Value Ref Range Status   Specimen Description BLOOD LEFT ANTECUBITAL  Final   Special Requests BOTTLES DRAWN AEROBIC AND ANAEROBIC 5CC  Final   Culture   Final    NO GROWTH 4 DAYS Performed at [facility]    Report Status PENDING  Incomplete  Culture, blood (routine x 2) Call MD if unable to obtain prior to antibiotics being given     Status: None (Preliminary result)   Collection Time: 01/27/16  2:15 PM  Result Value Ref Range Status   Specimen Description BLOOD RIGHT ANTECUBITAL  Final   Special Requests BOTTLES DRAWN AEROBIC AND ANAEROBIC 5CC  Final   Culture   Final    NO GROWTH 4 DAYS Performed at [facility]    Report Status PENDING  Incomplete  MRSA PCR Screening     Status: None   Collection Time: 01/27/16  4:55 PM  Result Value Ref Range Status   MRSA by PCR NEGATIVE NEGATIVE Final    Comment:        The GeneXpert MRSA Assay (FDA approved for NASAL specimens only), is one component of a comprehensive MRSA colonization surveillance program. It is not intended to diagnose MRSA infection nor to guide or monitor treatment for MRSA infections.      Studies: Dg Hip Unilat With Pelvis 2-3 Views Right  01/30/2016  CLINICAL DATA:  Pt unable to verbalize hx due to mental status (RN states pt has schizophrenia). Indicates posterior RIGHT hip pain. CT EXAM: DG HIP (WITH OR WITHOUT PELVIS) 2-3V RIGHT COMPARISON:  02/09/2012 FINDINGS: There is no evidence of hip fracture or dislocation. Chronic changes identified at the  right greater trochanter. IMPRESSION: No evidence for acute  abnormality. Electronically Signed   By: Norva Pavlov M.D.   On: 01/30/2016 16:00    Scheduled Meds: . amLODipine  10 mg Oral Daily  . doxycycline  100 mg Oral Q12H  . oseltamivir  75 mg Oral BID   Continuous Infusions:    Principal Problem:   Paranoid schizophrenia, chronic condition (HCC) Active Problems:   Acute respiratory failure with hypoxia (HCC)   Lobar pneumonia, unspecified organism (HCC)   History of schizophrenia   Non-compliance with treatment    Time spent: 25 min   Jeralyn Bennett  [facility] Pager [phone]. If 7PM-7AM, please contact night-coverage at [email or web address], password [facility] 01/31/2016, 1:47 PM  LOS: 4 days

## 2016-02-01 LAB — CULTURE, BLOOD (ROUTINE X 2)
CULTURE: NO GROWTH
Culture: NO GROWTH

## 2016-02-01 NOTE — Progress Notes (Signed)
[facility] PROGRESS NOTE  Jasmine MaidensSherry Nguyen JWJ:[id number] DOB: 04-Mar-1961 DOA: 01/27/2016 PCP: No PCP Per Patient  Assessment/Plan: 1. Influenza A -[name] presenting with complaints of cough, fevers, malaise, shortness of breath having flu swab coming back positive for influenza A.  -Continue Tamiflu 75 mg by mouth twice a day -Showing some clinical improvement today.I think from a medical perspective she will be ready for discharge in the next 24 hours.   2.  Possible community-acquired pneumonia -Initial chest x-ray did not show obvious infiltrate. -She was started on empiric antibiotic coverage with azithromycin and ceftriaxone for possible superimposed bacterial infection  -Blood cultures obtained on 01/27/2016 showing no growth to date -She was transitioned to doxy on 01/30/2016 given clinical improvement   3.  History of schizophrenia -During this hospitalization she was seen and evaluated by Dr. Elsie SaasJonnalagadda  -She was found to have psychotic symptoms. -No recommendations for initiating treatment from psychiatry. -I spoke to her brother on 01/29/2016 who expressed concerns over her psychiatric history. This was relayed to Dr. Elsie SaasJonnalagadda of psychiatry. Did not recommend treatment against her will. -On 02/01/2016 various family members have voiced concern regarding [name]'s mental health. I explained Psychiatry's evaluation, and the opinion that she was not a danger to herself or others. They remain concerned. On my evaluation she does not appear to be suicidal or homicidal, however has tangential speech.   4.  Hip Pain likely secondary to OA -She complains of ongoing hip pain involving right hip -Denies recent falls or trauma -XRay showed degenerative changes.  -continue medical management  5.  Hypertension -Blood pressures remain elevated, SBP's in the 160's to 180's -Start Norvasc 10 mg PO q daily, blood pressures remain elevated -Imdur 30 mg PO q daily added on  01/31/2016, with subsequent improvement in blood pressures  Code Status: Full code Family Communication: I spoke to her brother today who expressed concerns over her psychiatric history. Disposition Plan:  Psychiatric reevaluation    Consultants:  Psychiatry  Antibiotics:  Ceftriaxone  Azithromycin  Tamiflu  HPI/Subjective: [name] is a pleasant 55 year old female with a past medical history of schizophrenia, presented to the emergency department on 01/27/2016 with complaints of shortness of breath associate with cough and fever. Initial workup included a chest x-ray which did not reveal acute infiltrate. Flu swab came back positive for influenza A and she was started on Tamiflu.  Objective: Filed Vitals:   01/31/16 2130 02/01/16 0500  BP: 125/74 127/68  Pulse:  72  Temp: 98.2 F (36.8 C) 98.1 F (36.7 C)  Resp: 20 20    Intake/Output Summary (Last 24 hours) at 02/01/16 1502 Last data filed at 02/01/16 0920  Gross per 24 hour  Intake    240 ml  Output      0 ml  Net    240 ml   Filed Weights   01/27/16 1800  Weight: 87.1 kg (192 lb 0.3 oz)    Exam:   General:  No acute distress, awake and alert, having a flat affect, minimally verbal.  Cardiovascular: Tachycardic, regular rhythm normal S1-S2  Respiratory: Positive by lateral rhonchi, normal respiratory effort  Abdomen: Soft nontender nondistended  Musculoskeletal: No edema  Data Reviewed: Basic Metabolic Panel:  Recent Labs Lab 01/27/16 1019 01/28/16 0541 01/30/16 0610 01/31/16 0626  NA 134* 139 141 142  K 3.6 3.3* 3.6 3.5  CL 105 106 105 105  CO2 22 24 27 27   GLUCOSE 100* 80 102* 102*  BUN 10 9 7  6  CREATININE 0.67 0.68 0.61 0.56  CALCIUM 8.5* 7.7* 8.1* 8.5*   Liver Function Tests:  Recent Labs Lab 01/27/16 1019  AST 21  ALT 16  ALKPHOS 76  BILITOT 0.7  PROT 7.0  ALBUMIN 3.8   No results for input(s): LIPASE, AMYLASE in the last 168 hours.  Recent Labs Lab 01/27/16 1019   AMMONIA 19   CBC:  Recent Labs Lab 01/27/16 1019 01/28/16 0541 01/30/16 0610 01/31/16 0626  WBC 5.6 3.9* 3.8* 3.3*  NEUTROABS 4.6  --   --   --   HGB 13.0 12.5 12.7 12.8  HCT 41.4 40.3 40.5 39.5  MCV 96.1 97.8 96.0 90.6  PLT 179 144* 160 169   Cardiac Enzymes: No results for input(s): CKTOTAL, CKMB, CKMBINDEX, TROPONINI in the last 168 hours. BNP (last 3 results) No results for input(s): BNP in the last 8760 hours.  ProBNP (last 3 results) No results for input(s): PROBNP in the last 8760 hours.  CBG:  Recent Labs Lab 01/31/16 0756  GLUCAP 93    Recent Results (from the past 240 hour(s))  Culture, blood (routine x 2) Call MD if unable to obtain prior to antibiotics being given     Status: None (Preliminary result)   Collection Time: 01/27/16  2:10 PM  Result Value Ref Range Status   Specimen Description BLOOD LEFT ANTECUBITAL  Final   Special Requests BOTTLES DRAWN AEROBIC AND ANAEROBIC 5CC  Final   Culture   Final    NO GROWTH 4 DAYS Performed at [facility]    Report Status PENDING  Incomplete  Culture, blood (routine x 2) Call MD if unable to obtain prior to antibiotics being given     Status: None (Preliminary result)   Collection Time: 01/27/16  2:15 PM  Result Value Ref Range Status   Specimen Description BLOOD RIGHT ANTECUBITAL  Final   Special Requests BOTTLES DRAWN AEROBIC AND ANAEROBIC 5CC  Final   Culture   Final    NO GROWTH 4 DAYS Performed at [facility]    Report Status PENDING  Incomplete  MRSA PCR Screening     Status: None   Collection Time: 01/27/16  4:55 PM  Result Value Ref Range Status   MRSA by PCR NEGATIVE NEGATIVE Final    Comment:        The GeneXpert MRSA Assay (FDA approved for NASAL specimens only), is one component of a comprehensive MRSA colonization surveillance program. It is not intended to diagnose MRSA infection nor to guide or monitor treatment for MRSA infections.      Studies: No results  found.  Scheduled Meds: . amLODipine  10 mg Oral Daily  . doxycycline  100 mg Oral Q12H  . isosorbide mononitrate  30 mg Oral Daily   Continuous Infusions:    Principal Problem:   Paranoid schizophrenia, chronic condition (HCC) Active Problems:   Acute respiratory failure with hypoxia (HCC)   Lobar pneumonia, unspecified organism (HCC)   History of schizophrenia   Non-compliance with treatment    Time spent: 25 min   Jeralyn Bennett  [facility] Pager [phone]. If 7PM-7AM, please contact night-coverage at [email or web address], password [facility] 02/01/2016, 3:02 PM  LOS: 5 days

## 2016-02-01 NOTE — Care Management Important Message (Signed)
Important Message  Patient Details IM Letter given to Suzanne/Case Manager to present to Patient Name: Gean MaidensSherry Bamber MRN: [id number] Date of Birth: 21-Oct-1961   Medicare Important Message Given:  Yes    Haskell FlirtJamison, Fain Francis 02/01/2016, 11:32 AMImportant Message  Patient Details  Name: Gean MaidensSherry Kier MRN: [id number] Date of Birth: 21-Oct-1961   Medicare Important Message Given:  Yes    Haskell FlirtJamison, Olanrewaju Osborn 02/01/2016, 11:32 AM

## 2016-02-02 NOTE — Progress Notes (Signed)
[facility] PROGRESS NOTE  [name] ZOX:[id number] DOB: Apr 10, 1961 DOA: 01/27/2016 PCP: No PCP Per Patient  Interim summary [name] is a pleasant 55 year old female with a past medical history of schizophrenia, presented to the emergency department on 01/27/2016 with complaints of shortness of breath associate with cough and fever. Initial workup included a chest x-ray which did not reveal acute infiltrate. Flu swab came back positive for influenza A and she was started on Tamiflu. With regard to her history of schizophrenia she was seen and evaluated by Dr.Jonnalagadda of psychiatry who did not that she was a danger to herself or to others and furthermore had capacity to make her own medical decisions including refusing treatment. Family members have verbalize concern over this, particular with discharge disposition. She was seen and evaluated by physical therapy who recommended skilled nursing facility placement. Social work was consulted for SNF placement. From a medical standpoint she is stable for discharge once bed available.  Assessment/Plan: 1. Influenza A -[name] presenting with complaints of cough, fevers, malaise, shortness of breath having flu swab coming back positive for influenza A.  -Continue Tamiflu 75 mg by mouth twice a day for a planned 5 day course -Showing daily clinical improvement -I think from a medical perspective she will be ready for discharge once bed available at SNF  2.  Possible community-acquired pneumonia -Initial chest x-ray did not show obvious infiltrate. -She was started on empiric antibiotic coverage with azithromycin and ceftriaxone for possible superimposed bacterial infection  -Blood cultures obtained on 01/27/2016 showing no growth to date -She was transitioned to doxy on 01/30/2016 given clinical improvement   3.  History of schizophrenia -During this hospitalization she was seen and evaluated by Dr. Elsie Saas  -She was found to  have psychotic symptoms. -No recommendations for initiating treatment from psychiatry. -I spoke to her brother on 01/29/2016 who expressed concerns over her psychiatric history. This was relayed to Dr. Elsie Saas of psychiatry. Did not recommend treatment against her will. -On 02/01/2016 various family members have voiced concern regarding [name]'s mental health. I explained Psychiatry's evaluation, and the opinion that she was not a danger to herself or others. They remain concerned. On my evaluation she does not appear to be suicidal or homicidal, however has tangential speech. -I spoke with Dr Elsie Saas on 02/02/2016 who felt she was stable to be discharge from a psychiatric perspective.   4.  Hip Pain likely secondary to OA -She complains of ongoing hip pain involving right hip -Denies recent falls or trauma -XRay showed degenerative changes.  -continue medical management  5.  Hypertension -Blood pressures remain elevated, SBP's in the 160's to 180's -Start Norvasc 10 mg PO q daily, blood pressures remain elevated -Imdur 30 mg PO q daily added on 01/31/2016, with subsequent improvement in blood pressures  Code Status: Full code Family Communication: I spoke to her brother today who expressed concerns over her psychiatric history. Disposition Plan:  Psychiatric reevaluation    Consultants:  Psychiatry  Antibiotics:  Ceftriaxone  Azithromycin  Tamiflu  HPI/Subjective: She looks better today, sitting at bedside chair, breathing comfortably on room air.  Objective: Filed Vitals:   02/02/16 0533 02/02/16 1000  BP: 138/88 152/57  Pulse: 82 80  Temp: 97.4 F (36.3 C) 98.2 F (36.8 C)  Resp: 20 20   No intake or output data in the 24 hours ending 02/02/16 1421 Filed Weights   01/27/16 1800  Weight: 87.1 kg (192 lb 0.3 oz)    Exam:  General:  No acute distress, awake and alert, having a flat affect, minimally verbal.  Cardiovascular: Tachycardic, regular  rhythm normal S1-S2  Respiratory: Positive by lateral rhonchi, normal respiratory effort  Abdomen: Soft nontender nondistended  Musculoskeletal: No edema  Data Reviewed: Basic Metabolic Panel:  Recent Labs Lab 01/27/16 1019 01/28/16 0541 01/30/16 0610 01/31/16 0626  NA 134* 139 141 142  K 3.6 3.3* 3.6 3.5  CL 105 106 105 105  CO2 22 24 27 27   GLUCOSE 100* 80 102* 102*  BUN 10 9 7 6   CREATININE 0.67 0.68 0.61 0.56  CALCIUM 8.5* 7.7* 8.1* 8.5*   Liver Function Tests:  Recent Labs Lab 01/27/16 1019  AST 21  ALT 16  ALKPHOS 76  BILITOT 0.7  PROT 7.0  ALBUMIN 3.8   No results for input(s): LIPASE, AMYLASE in the last 168 hours.  Recent Labs Lab 01/27/16 1019  AMMONIA 19   CBC:  Recent Labs Lab 01/27/16 1019 01/28/16 0541 01/30/16 0610 01/31/16 0626  WBC 5.6 3.9* 3.8* 3.3*  NEUTROABS 4.6  --   --   --   HGB 13.0 12.5 12.7 12.8  HCT 41.4 40.3 40.5 39.5  MCV 96.1 97.8 96.0 90.6  PLT 179 144* 160 169   Cardiac Enzymes: No results for input(s): CKTOTAL, CKMB, CKMBINDEX, TROPONINI in the last 168 hours. BNP (last 3 results) No results for input(s): BNP in the last 8760 hours.  ProBNP (last 3 results) No results for input(s): PROBNP in the last 8760 hours.  CBG:  Recent Labs Lab 01/31/16 0756  GLUCAP 93    Recent Results (from the past 240 hour(s))  Culture, blood (routine x 2) Call MD if unable to obtain prior to antibiotics being given     Status: None   Collection Time: 01/27/16  2:10 PM  Result Value Ref Range Status   Specimen Description BLOOD LEFT ANTECUBITAL  Final   Special Requests BOTTLES DRAWN AEROBIC AND ANAEROBIC 5CC  Final   Culture   Final    NO GROWTH 5 DAYS Performed at [facility]    Report Status 02/01/2016 FINAL  Final  Culture, blood (routine x 2) Call MD if unable to obtain prior to antibiotics being given     Status: None   Collection Time: 01/27/16  2:15 PM  Result Value Ref Range Status   Specimen  Description BLOOD RIGHT ANTECUBITAL  Final   Special Requests BOTTLES DRAWN AEROBIC AND ANAEROBIC 5CC  Final   Culture   Final    NO GROWTH 5 DAYS Performed at [facility]    Report Status 02/01/2016 FINAL  Final  MRSA PCR Screening     Status: None   Collection Time: 01/27/16  4:55 PM  Result Value Ref Range Status   MRSA by PCR NEGATIVE NEGATIVE Final    Comment:        The GeneXpert MRSA Assay (FDA approved for NASAL specimens only), is one component of a comprehensive MRSA colonization surveillance program. It is not intended to diagnose MRSA infection nor to guide or monitor treatment for MRSA infections.      Studies: No results found.  Scheduled Meds: . amLODipine  10 mg Oral Daily  . doxycycline  100 mg Oral Q12H  . isosorbide mononitrate  30 mg Oral Daily   Continuous Infusions:    Principal Problem:   Paranoid schizophrenia, chronic condition (HCC) Active Problems:   Acute respiratory failure with hypoxia (HCC)   Lobar pneumonia, unspecified organism (  HCC)   History of schizophrenia   Non-compliance with treatment    Time spent: 25 min   Jeralyn Bennett  [facility] Pager [phone]. If 7PM-7AM, please contact night-coverage at [email or web address], password [facility] 02/02/2016, 2:21 PM  LOS: 6 days

## 2016-02-02 NOTE — Evaluation (Signed)
Physical Therapy Evaluation Patient Details Name: [name] MRN: [id number] DOB: 1961-06-29 Today's Date: 02/02/2016   History of Present Illness  Pt is a 55 year old female admitted for influenza A with hx of chronic paranoid schizophrenia   Clinical Impression  Pt admitted with above diagnosis. Pt currently with functional limitations due to the deficits listed below (see PT Problem List).  Pt will benefit from skilled PT to increase their independence and safety with mobility to allow discharge to the venue listed below.  Pt unsteady and reporting dizziness with ambulation today.  Pt may benefit from ST-SNF if possible.       Follow Up Recommendations SNF;Supervision for mobility/OOB    Equipment Recommendations  Cane    Recommendations for Other Services       Precautions / Restrictions Precautions Precautions: Fall Precaution Comments: droplet Restrictions Weight Bearing Restrictions: No      Mobility  Bed Mobility               General bed mobility comments: pt in recliner on arrival, pt requested therapist don shoes  Transfers Overall transfer level: Needs assistance Equipment used: None Transfers: Sit to/from Stand Sit to Stand: Min guard            Ambulation/Gait Ambulation/Gait assistance: Min assist Ambulation Distance (Feet): 80 Feet Assistive device: 1 person hand held assist Gait Pattern/deviations: Step-through pattern;Wide base of support;Decreased stride length     General Gait Details: pt takes uncertain steps and unsteady at times, states she normally ambulates better, provided HHA for more support, distance limited to pt reporting dizziness, SpO2 98% room air upon return to recliner  Stairs            Wheelchair Mobility    Modified Rankin (Stroke Patients Only)       Balance Overall balance assessment: Needs assistance (pt states "not really" to hx of falls)         Standing balance support: No upper extremity  supported Standing balance-Leahy Scale: Fair                               Pertinent Vitals/Pain Pain Assessment: 0-10 Pain Score: 5  Pain Location: L jaw Pain Descriptors / Indicators: Aching Pain Intervention(s): Limited activity within patient's tolerance;Patient requesting pain meds-RN notified;RN gave pain meds during session;Monitored during session    Home Living Family/patient expects to be discharged to:: Shelter/Homeless (homeless)                      Prior Function Level of Independence: Independent               Hand Dominance        Extremity/Trunk Assessment               Lower Extremity Assessment: Generalized weakness         Communication   Communication: No difficulties  Cognition Arousal/Alertness: Awake/alert                     General Comments: pt with hx of Paranoid schizophrenia, pt nonsensical at times, mumbling to herself most of the time, answers questions mostly appropriate    General Comments      Exercises        Assessment/Plan    PT Assessment Patient needs continued PT services  PT Diagnosis Difficulty walking   PT Problem List Decreased strength;Decreased balance;Decreased mobility;Decreased knowledge  of use of DME  PT Treatment Interventions DME instruction;Gait training;Patient/family education;Functional mobility training;Therapeutic activities;Therapeutic exercise;Balance training   PT Goals (Current goals can be found in the Care Plan section) Acute Rehab PT Goals PT Goal Formulation: With patient Time For Goal Achievement: 02/09/16 Potential to Achieve Goals: Good    Frequency Min 3X/week   Barriers to discharge        Co-evaluation               End of Session Equipment Utilized During Treatment: Gait belt Activity Tolerance: Patient limited by fatigue Patient left: in chair;with chair alarm set Nurse Communication: Mobility status         Time:  1610-9604 PT Time Calculation (min) (ACUTE ONLY): 13 min   Charges:   PT Evaluation $PT Eval Low Complexity: 1 Procedure     PT G Codes:        Javaria Knapke,KATHrine E 02/02/2016, 12:13 PM Zenovia Jarred, PT, DPT 02/02/2016 Pager: [phone]

## 2016-02-03 NOTE — Progress Notes (Signed)
Occupational Therapy Evaluation Patient Details Name: Jasmine MaidensSherry Nguyen MRN: [id number] DOB: 1961-08-24 Today's Date: 02/03/2016    History of Present Illness Pt is a 55 year old female admitted for influenza A with hx of chronic paranoid schizophrenia    Clinical Impression   Patient admitted with above diagnoses. She presents to OT with decreased ADL independence and safety due to the deficits listed below. Patient will benefit from skilled OT to maximize function and to facilitate a safe discharge. Recommend SNF at this time. OT will follow.    Follow Up Recommendations  SNF;Supervision/Assistance - 24 hour    Equipment Recommendations  Other (comment) (tbd at next venue of care)    Recommendations for Other Services       Precautions / Restrictions Precautions Precautions: Fall Precaution Comments: droplet      Mobility Bed Mobility               General bed mobility comments: pt in recliner on arrival  Transfers Overall transfer level: Needs assistance Equipment used: None Transfers: Sit to/from Stand Sit to Stand: Min guard              Balance                                            ADL Overall ADL's : Needs assistance/impaired Eating/Feeding: Independent;Sitting   Grooming: Wash/dry hands;Min guard;Standing                   Toilet Transfer: Minimal assistance;Ambulation;BSC   Toileting- Clothing Manipulation and Hygiene: Minimal assistance       Functional mobility during ADLs: Minimal assistance General ADL Comments: Patient received up in recliner; agreeable to OT. Tangential speech, difficult to follow, needed some cues to redirect. Ambulated to bathroom for toilet transfer, toileting, grooming. Then patient requested to walk in hallway. Refused RW. Handheld assistance provided min A. Patient unsteady at times. No overt LOB. Returned to chair at end of session with chair alarm in place.     Vision      Perception     Praxis      Pertinent Vitals/Pain Pain Assessment: Faces Faces Pain Scale: Hurts a little bit Pain Location: hips Pain Descriptors / Indicators: Sore Pain Intervention(s): Limited activity within patient's tolerance;Monitored during session;Repositioned     Hand Dominance Right   Extremity/Trunk Assessment Upper Extremity Assessment Upper Extremity Assessment: Overall WFL for tasks assessed   Lower Extremity Assessment Lower Extremity Assessment: Defer to PT evaluation       Communication Communication Communication: No difficulties   Cognition Arousal/Alertness: Awake/alert                     General Comments: pt with hx of Paranoid schizophrenia, pt nonsensical at times, mumbling to herself most of the time, answers questions mostly appropriate   General Comments       Exercises       Shoulder Instructions      Home Living Family/patient expects to be discharged to:: Shelter/Homeless                                        Prior Functioning/Environment Level of Independence: Independent             OT Diagnosis: Generalized weakness;Altered  mental status   OT Problem List: Decreased strength;Decreased activity tolerance;Impaired balance (sitting and/or standing);Decreased safety awareness;Decreased knowledge of use of DME or AE;Decreased knowledge of precautions;Pain   OT Treatment/Interventions: Self-care/ADL training;DME and/or AE instruction;Therapeutic activities;Patient/family education;Therapeutic exercise    OT Goals(Current goals can be found in the care plan section) Acute Rehab OT Goals Patient Stated Goal: none stated, but seems to be agreeable to go to rehab OT Goal Formulation: With patient Time For Goal Achievement: 02/17/16 Potential to Achieve Goals: Good ADL Goals Pt Will Perform Grooming: with supervision;standing Pt Will Perform Upper Body Bathing: with set-up;sitting Pt Will Perform Lower  Body Bathing: with supervision;sit to/from stand Pt Will Perform Upper Body Dressing: with set-up;sitting Pt Will Perform Lower Body Dressing: with supervision;sit to/from stand Pt Will Transfer to Toilet: with supervision;ambulating;regular height toilet Pt Will Perform Toileting - Clothing Manipulation and hygiene: with supervision;sit to/from stand  OT Frequency: Min 2X/week   Barriers to D/C: Other (comment) (homeless)          Co-evaluation              End of Session Nurse Communication: Mobility status  Activity Tolerance: Patient tolerated treatment well Patient left: in chair;with call bell/phone within reach;with chair alarm set   Time: 9604-5409 OT Time Calculation (min): 24 min Charges:  OT General Charges $OT Visit: 1 Procedure OT Evaluation $OT Eval Moderate Complexity: 1 Procedure G-Codes:    Rachel Rison A 03/02/16, 12:30 PM

## 2016-02-03 NOTE — NC FL2 (Signed)
[place] MEDICAID FL2 LEVEL OF CARE SCREENING TOOL     IDENTIFICATION  Patient Name: Jasmine MaidensSherry Holifield Birthdate: Nov 07, 1961 Sex: female Admission Date (Current Location): 01/27/2016  [facility] [place]   Facility and Address:  [facility],  [address], [place] [id number]      Provider Number: [id number]  Attending Physician Name and Address:  Jeralyn BennettEzequiel Zamora, MD  Relative Name and Phone Number:       Current Level of Care: Hospital Recommended Level of Care: Skilled Nursing Facility Prior Approval Number:    Date Approved/Denied:   PASRR Number: [id number]  Discharge Plan: SNF    Current Diagnoses: Patient Active Problem List   Diagnosis Date Noted  . Paranoid schizophrenia, chronic condition (HCC) 01/28/2016  . Non-compliance with treatment 01/28/2016  . Acute respiratory failure with hypoxia (HCC) 01/27/2016  . Lobar pneumonia, unspecified organism (HCC) 01/27/2016  . History of schizophrenia 01/27/2016    Orientation RESPIRATION BLADDER Height & Weight     Self, Situation, Place  Normal Continent Weight: 192 lb 0.3 oz (87.1 kg) Height:  5\' 4"  (162.6 cm)  BEHAVIORAL SYMPTOMS/MOOD NEUROLOGICAL BOWEL NUTRITION STATUS      Continent Diet (Heart Healthy)  AMBULATORY STATUS COMMUNICATION OF NEEDS Skin   Extensive Assist Verbally Normal                       Personal Care Assistance Level of Assistance  Bathing, Dressing Bathing Assistance: Limited assistance   Dressing Assistance: Limited assistance     Functional Limitations Info             SPECIAL CARE FACTORS FREQUENCY  PT (By licensed PT), OT (By licensed OT)     PT Frequency: 5 OT Frequency: 5            Contractures      Additional Factors Info  Code Status, Allergies, Isolation Precautions Code Status Info: Fullcode Allergies Info: Codeine     Isolation Precautions Info: Droplet Precautions - tested positive for Influenza A, completed  Tamiflu 3/20     Current Medications (02/03/2016):  This is the current hospital active medication list Current Facility-Administered Medications  Medication Dose Route Frequency Provider Last Rate Last Dose  . acetaminophen (TYLENOL) tablet 650 mg  650 mg Oral Q4H PRN Roma KayserKatherine P Schorr, NP   650 mg at 02/03/16 1451  . amLODipine (NORVASC) tablet 10 mg  10 mg Oral Daily Jeralyn BennettEzequiel Zamora, MD   10 mg at 02/03/16 1023  . hydrALAZINE (APRESOLINE) injection 10 mg  10 mg Intravenous Q6H PRN Rolan Lipahomas Michael Callahan, NP   10 mg at 01/30/16 2126  . ipratropium-albuterol (DUONEB) 0.5-2.5 (3) MG/3ML nebulizer solution 3 mL  3 mL Nebulization Q4H PRN Alison MurrayAlma M Devine, MD      . isosorbide mononitrate (IMDUR) 24 hr tablet 30 mg  30 mg Oral Daily Jeralyn BennettEzequiel Zamora, MD   30 mg at 02/03/16 1023  . MUSCLE RUB CREA   Topical PRN Jeralyn BennettEzequiel Zamora, MD      . ondansetron [facility](ZOFRAN) injection 4 mg  4 mg Intravenous Q6H PRN Rolan Lipahomas Michael Callahan, NP   4 mg at 01/29/16 2020  . oxyCODONE (Oxy IR/ROXICODONE) immediate release tablet 5 mg  5 mg Oral Q6H PRN Jeralyn BennettEzequiel Zamora, MD   5 mg at 02/02/16 2127     Discharge Medications: Please see discharge summary for a list of discharge medications.  Relevant Imaging Results:  Relevant Lab Results:   Additional Information  SSN: [id number]  Arlyss Repress, LCSW

## 2016-02-03 NOTE — Progress Notes (Signed)
[facility] PROGRESS NOTE  [name]:[id number] DOB: November 15, 1960 DOA: 01/27/2016 PCP: No PCP Per Patient  Interim summary [name] is a pleasant 55 year old female with a past medical history of schizophrenia, presented to the emergency department on 01/27/2016 with complaints of shortness of breath associate with cough and fever. Initial workup included a chest x-ray which did not reveal acute infiltrate. Flu swab came back positive for influenza A and she was started on Tamiflu. With regard to her history of schizophrenia she was seen and evaluated by Dr.Jonnalagadda of psychiatry who did not that she was a danger to herself or to others and furthermore had capacity to make her own medical decisions including refusing treatment. Family members have verbalize concern over this, particular with discharge disposition. She was seen and evaluated by physical therapy who recommended skilled nursing facility placement. Social work was consulted for SNF placement. From a medical standpoint she is stable for discharge once bed available.  Assessment/Plan: 1. Influenza A -Jasmine [name] presenting with complaints of cough, fevers, malaise, shortness of breath having flu swab coming back positive for influenza A.  -Completed 5 day course of Tamiflu -Showing daily clinical improvement -I think from a medical perspective she will be ready for discharge once bed available at SNF  2.  Possible community-acquired pneumonia -Initial chest x-ray did not show obvious infiltrate. -She was started on empiric antibiotic coverage with azithromycin and ceftriaxone for possible superimposed bacterial infection  -Blood cultures obtained on 01/27/2016 showing no growth to date -She was transitioned to doxy on 01/30/2016 given clinical improvement  -She has received 7 days of antimicrobial therapy, Doxy stopped on 02/03/2016.   3.  History of schizophrenia -During this hospitalization she was seen and evaluated  by Dr. Elsie Saas  -She was found to have psychotic symptoms. -No recommendations for initiating treatment from psychiatry. -I spoke to her brother on 01/29/2016 who expressed concerns over her psychiatric history. This was relayed to Dr. Elsie Saas of psychiatry. Did not recommend treatment against her will. -On 02/01/2016 various family members have voiced concern regarding [name]'s mental health. I explained Psychiatry's evaluation, and the opinion that she was not a danger to herself or others. They remain concerned. On my evaluation she does not appear to be suicidal or homicidal, however has tangential speech. -I spoke with Dr Elsie Saas on 02/02/2016 who felt she was stable to be discharge from a psychiatric perspective.   4.  Hip Pain likely secondary to OA -She complains of ongoing hip pain involving right hip -Denies recent falls or trauma -XRay showed degenerative changes.  -continue medical management  5.  Hypertension -Blood pressures remain elevated, SBP's in the 160's to 180's -Start Norvasc 10 mg PO q daily, blood pressures remain elevated -Imdur 30 mg PO q daily added on 01/31/2016, with subsequent improvement in blood pressures  Code Status: Full code Family Communication: I spoke to her brother and mother during this hospitalization Disposition Plan:  Plan for rehabilitation at skilled nursing facility; currently awaiting placement   Consultants:  Psychiatry  Antibiotics:  Ceftriaxone  Azithromycin  Tamiflu  HPI/Subjective: She reports ongoing right hip pain otherwise tolerating by mouth intake. Breathing comfortably in room air  Objective: Filed Vitals:   02/03/16 0615 02/03/16 1525  BP: 141/86 127/66  Pulse: 80 88  Temp: 98.4 F (36.9 C) 97.9 F (36.6 C)  Resp: 20 20    Intake/Output Summary (Last 24 hours) at 02/03/16 1527 Last data filed at 02/03/16 1003  Gross per 24 hour  Intake    480 ml  Output      0 ml  Net    480 ml   Filed  Weights   01/27/16 1800  Weight: 87.1 kg (192 lb 0.3 oz)    Exam:   General:  No acute distress, awake and alert, having a flat affect  Cardiovascular: Tachycardic, regular rhythm normal S1-S2  Respiratory: Positive by lateral rhonchi, normal respiratory effort  Abdomen: Soft nontender nondistended  Musculoskeletal: No edema  Data Reviewed: Basic Metabolic Panel:  Recent Labs Lab 01/28/16 0541 01/30/16 0610 01/31/16 0626  NA 139 141 142  K 3.3* 3.6 3.5  CL 106 105 105  CO2 24 27 27   GLUCOSE 80 102* 102*  BUN 9 7 6   CREATININE 0.68 0.61 0.56  CALCIUM 7.7* 8.1* 8.5*   Liver Function Tests: No results for input(s): AST, ALT, ALKPHOS, BILITOT, PROT, ALBUMIN in the last 168 hours. No results for input(s): LIPASE, AMYLASE in the last 168 hours. No results for input(s): AMMONIA in the last 168 hours. CBC:  Recent Labs Lab 01/28/16 0541 01/30/16 0610 01/31/16 0626  WBC 3.9* 3.8* 3.3*  HGB 12.5 12.7 12.8  HCT 40.3 40.5 39.5  MCV 97.8 96.0 90.6  PLT 144* 160 169   Cardiac Enzymes: No results for input(s): CKTOTAL, CKMB, CKMBINDEX, TROPONINI in the last 168 hours. BNP (last 3 results) No results for input(s): BNP in the last 8760 hours.  ProBNP (last 3 results) No results for input(s): PROBNP in the last 8760 hours.  CBG:  Recent Labs Lab 01/31/16 0756  GLUCAP 93    Recent Results (from the past 240 hour(s))  Culture, blood (routine x 2) Call MD if unable to obtain prior to antibiotics being given     Status: None   Collection Time: 01/27/16  2:10 PM  Result Value Ref Range Status   Specimen Description BLOOD LEFT ANTECUBITAL  Final   Special Requests BOTTLES DRAWN AEROBIC AND ANAEROBIC 5CC  Final   Culture   Final    NO GROWTH 5 DAYS Performed at [facility]    Report Status 02/01/2016 FINAL  Final  Culture, blood (routine x 2) Call MD if unable to obtain prior to antibiotics being given     Status: None   Collection Time: 01/27/16  2:15  PM  Result Value Ref Range Status   Specimen Description BLOOD RIGHT ANTECUBITAL  Final   Special Requests BOTTLES DRAWN AEROBIC AND ANAEROBIC 5CC  Final   Culture   Final    NO GROWTH 5 DAYS Performed at [facility]    Report Status 02/01/2016 FINAL  Final  MRSA PCR Screening     Status: None   Collection Time: 01/27/16  4:55 PM  Result Value Ref Range Status   MRSA by PCR NEGATIVE NEGATIVE Final    Comment:        The GeneXpert MRSA Assay (FDA approved for NASAL specimens only), is one component of a comprehensive MRSA colonization surveillance program. It is not intended to diagnose MRSA infection nor to guide or monitor treatment for MRSA infections.      Studies: No results found.  Scheduled Meds: . amLODipine  10 mg Oral Daily  . doxycycline  100 mg Oral Q12H  . isosorbide mononitrate  30 mg Oral Daily   Continuous Infusions:    Principal Problem:   Paranoid schizophrenia, chronic condition (HCC) Active Problems:   Acute respiratory failure with hypoxia (HCC)   Lobar pneumonia, unspecified  organism [facility])   History of schizophrenia   Non-compliance with treatment    Time spent: 15 min   Jeralyn Bennett  [facility] Pager [phone]. If 7PM-7AM, please contact night-coverage at [email or web address], password [facility] 02/03/2016, 3:27 PM  LOS: 7 days

## 2016-02-03 NOTE — Progress Notes (Signed)
CSW left a message with the Pt's brother for a return call to assist with Pt discharge planning.   CSW awaiting a return call for approval to fax Pt information to varies facilities.   Leron Croakassandra Myasia Sinatra MSW, LCSWA  [facility]  [phone]

## 2016-02-04 DIAGNOSIS — I10 Essential (primary) hypertension: Secondary | ICD-10-CM

## 2016-02-04 DIAGNOSIS — J189 Pneumonia, unspecified organism: Secondary | ICD-10-CM

## 2016-02-04 DIAGNOSIS — M25551 Pain in right hip: Secondary | ICD-10-CM

## 2016-02-04 DIAGNOSIS — J101 Influenza due to other identified influenza virus with other respiratory manifestations: Secondary | ICD-10-CM

## 2016-02-04 MED ORDER — OXYCODONE HCL 5 MG PO TABS: 5.0000 mg | ORAL_TABLET | Freq: Four times a day (QID) | ORAL | Status: DC | PRN

## 2016-02-04 MED ORDER — AMLODIPINE BESYLATE 10 MG PO TABS: 10.0000 mg | ORAL_TABLET | Freq: Every day | ORAL | Status: DC

## 2016-02-04 MED ORDER — ISOSORBIDE MONONITRATE ER 30 MG PO TB24: 30.0000 mg | ORAL_TABLET | Freq: Every day | ORAL | Status: DC

## 2016-02-04 NOTE — Progress Notes (Signed)
Physical Therapy Treatment Patient Details Name: [name] MRN: [id number] DOB: 1961/06/20 Today's Date: 02/04/2016    History of Present Illness Pt is a 55 year old female admitted for influenza A with hx of chronic paranoid schizophrenia     PT Comments    Assisted with amb a great distance in hallway HHA with noted unsteady, choppy gait and occasional need to hold to rail.  Returned to room then performed BERG balance test. Biggest diffuculty was one leg stance and alternating step up.  Pt would benefit from ST rehab.   Follow Up Recommendations  SNF     Equipment Recommendations       Recommendations for Other Services       Precautions / Restrictions Precautions Precautions: Fall Precaution Comments: droplet Restrictions Weight Bearing Restrictions: No    Mobility  Bed Mobility               General bed mobility comments: pt in recliner on arrival  Transfers Overall transfer level: Needs assistance Equipment used: None Transfers: Sit to/from Stand Sit to Stand: Supervision;Min guard         General transfer comment: able to stand without use of hands but uses hands to control stand to sit  Ambulation/Gait Ambulation/Gait assistance: Min guard Ambulation Distance (Feet): 120 Feet Assistive device: 1 person hand held assist Gait Pattern/deviations: Step-through pattern;Decreased stride length;Wide base of support Gait velocity: decreased    General Gait Details: occassional use of rail to steady self with c/o B hip pain/low back pain (chronic).  Tolerated increased distance.  present with a choppy, unsteady gait with delayed corrective reaction to midline.  HIGH FALL RISK.   Stairs            Wheelchair Mobility    Modified Rankin (Stroke Patients Only)       Balance  BERG balance test pt scored 40/56 HIGH FALL RISK                                  Cognition Arousal/Alertness: Awake/alert                      General Comments: pt with hx of Paranoid schizophrenia, pt nonsensical at times, mumbling to herself most of the time, answers questions mostly appropriate    Exercises      General Comments        Pertinent Vitals/Pain Pain Assessment: Faces Faces Pain Scale: Hurts little more Pain Location: hips Pain Descriptors / Indicators: Sore Pain Intervention(s): Premedicated before session;Repositioned    Home Living                      Prior Function            PT Goals (current goals can now be found in the care plan section) Progress towards PT goals: Progressing toward goals    Frequency  Min 3X/week    PT Plan      Co-evaluation             End of Session Equipment Utilized During Treatment: Gait belt Activity Tolerance: Patient limited by fatigue Patient left: in chair;with chair alarm set     Time: 1425-1450 PT Time Calculation (min) (ACUTE ONLY): 25 min  Charges:  $Gait Training: 23-37 mins                    G  Codes:      Rica Koyanagi  PTA [facility]  Acute  Rehab Pager      [phone]

## 2016-02-04 NOTE — Clinical Social Work Placement (Signed)
   CLINICAL SOCIAL WORK PLACEMENT  NOTE  Date:  02/04/2016  Patient Details  Name: [name] MRN: [id number] Date of Birth: 04/11/1961  Clinical Social Work is seeking post-discharge placement for this patient at the Skilled  Nursing Facility level of care (*CSW will initial, date and re-position this form in  chart as items are completed):  Yes   Patient/family provided with [place] Clinical Social Work Department's list of facilities offering this level of care within the geographic area requested by the patient (or if unable, by the patient's family).  Yes   Patient/family informed of their freedom to choose among providers that offer the needed level of care, that participate in Medicare, Medicaid or managed care program needed by the patient, have an available bed and are willing to accept the patient.  Yes   Patient/family informed of [place]'s ownership interest in [facility] and [facility], as well as of the fact that they are under no obligation to receive care at these facilities.  PASRR submitted to EDS on 02/03/16     PASRR number received on 02/03/16     Existing PASRR number confirmed on       FL2 transmitted to all facilities in geographic area requested by pt/family on 02/03/16     FL2 transmitted to all facilities within larger geographic area on       Patient informed that his/her managed care company has contracts with or will negotiate with certain facilities, including the following:        Yes   Patient/family informed of bed offers received.  [facility]     Physician recommends and patient chooses bed at      Patient to [facility] on 02/04/16.  Patient to be transferred to facility by [facility]     Patient family notified on 02/04/16 of transfer.  Name of family member notified:  BROTER     PHYSICIAN       Additional Comment: Pt / brother are in agreement with  d/c to [facility] today. [facility] transport required. Medical necessity form completed. D/C Summary sent to SNF for review. Scripts included in d/c packet. # for report provided to nsg.   _______________________________________________ Royetta AsalHaidinger, Avree Szczygiel Lee, LCSW  [phone] 02/04/2016, 3:31 PM

## 2016-02-04 NOTE — Progress Notes (Signed)
CSW assisting with d/c planning. Pt has a ST Rehab bed at [facility] today if she is ready for d/c. CSW will assist with d/c planning to SNF.  Cori RazorJamie Jenica Costilow LCSW [phone]

## 2016-02-04 NOTE — Progress Notes (Signed)
PHYSICAL THERAPY   02/04/16 1512  Berg Balance Test  Sit to Stand 4  Standing Unsupported 4  Sitting with Back Unsupported but Feet Supported on Floor or Stool 4  Stand to Sit 4  Transfers 4  Standing Unsupported with Eyes Closed 4  Standing Ubsupported with Feet Together 2        fair  From Standing, Reach Forward with Outstretched Arm 3  From Standing Position, Pick up Object from Floor 3  From Standing Position, Turn to Look Behind Over each Shoulder 3  Turn 360 Degrees 2          fair  Standing Unsupported, Alternately Place Feet on Step/Stool 1         poor  Standing Unsupported, One Foot in Front 1        poor  Standing on One Leg 1        poor  Total Score 40   Greatest difficulty with one leg stance, alternating step up, tandum stance Indicating HIGH FALL RISK  Felecia ShellingLori Taijah Macrae  PTA [facility]  Acute  Rehab Pager      [phone]

## 2016-02-04 NOTE — Discharge Summary (Signed)
Physician Discharge Summary  [name] ZOX:[id number] DOB: [birth year] DOA: 01/27/2016  PCP: No PCP Per Patient  Admit date: 01/27/2016 Discharge date: 02/04/2016  Time spent: 65 minutes  Recommendations for Outpatient Follow-up:  1. Discharged to skilled nursing facility. Patient will follow-up with M.D. at the skilled nursing facility. 2. After discharge from skilled nursing facility patient is to follow-up with psychiatry as outpatient.   Discharge Diagnoses:  Principal Problem:   CAP (community acquired pneumonia) Active Problems:   Influenza A   Acute respiratory failure with hypoxia (HCC)   Lobar pneumonia, unspecified organism ([place])   History of schizophrenia   Paranoid schizophrenia, chronic condition ([place])   Non-compliance with treatment   Essential hypertension   Right hip pain   Discharge Condition: stable  Diet recommendation: Regular  Filed Weights   01/27/16 1800  Weight: 87.1 kg (192 lb 0.3 oz)    History of present illness:  Per Dr Charlies Silvers 55 year old female with past medical history of schizophrenia, homeless who presented to [facility] ED for evaluation of cough and fever. Apparently she was found outside of the restaurant wondering and because of cough and not feeling well she was brought in for evaluation. Pt is somewhat of a fair historian, does not answer questions consistently. She is mentioning her body being a skeleton. When asked about her fever and cough she said she is not feeling well. No respiratory distress. No abdominal pain, nausea or vomiting. No chest pain. No diarrhea or constipation.   In ED, pt was hemodynamically stable. Her Tmax was 100.8 F, HR 129, RR 35. Please note sepsis criteria not met. CXR showed bronchitic changes, no evidence of pneumonia. Started on azithro and rocephin. She was admitted for management of possible pneumonia.    Hospital Course:  1. Influenza A -[name] presenting with complaints of cough, fevers, malaise,  shortness of breath having flu swab coming back positive for influenza A.  -Completed 5 day course of Tamiflu -Clinical improvement.  2. Possible community-acquired pneumonia -Initial chest x-ray did not show obvious infiltrate. -She was started on empiric antibiotic coverage with azithromycin and ceftriaxone for possible superimposed bacterial infection  -Blood cultures obtained on 01/27/2016 showed no growth to date -She was transitioned to doxy on 01/30/2016 given clinical improvement  -She has received 7 days of antimicrobial therapy, Doxy stopped on 02/03/2016.   3. History of schizophrenia -During this hospitalization she was seen and evaluated by Dr. Louretta Shorten  -She was found to have psychotic symptoms. -No recommendations for initiating treatment from psychiatry. -I spoke to her brother on 01/29/2016 who expressed concerns over her psychiatric history. This was relayed to Dr. Louretta Shorten of psychiatry. Did not recommend treatment against her will. -On 02/01/2016 various family members have voiced concern regarding [name]'s mental health. I explained Psychiatry's evaluation, and the opinion that she was not a danger to herself or others. They remain concerned. On Dr Dairl Ponder evaluation she does not appear to be suicidal or homicidal, however had tangential speech. -Dr Coralyn Pear spoke with Dr Louretta Shorten on 02/02/2016 who felt she was stable to be discharge from a psychiatric perspective.  Outpatient follow-up.  4. Hip Pain likely secondary to OA -She complains of ongoing hip pain involving right hip -Denied recent falls or trauma -XRay showed degenerative changes.  -continued medical management  5. Hypertension -Blood pressures remain elevated, SBP's in the 160's to 180's -Started Norvasc 10 mg PO q daily, blood pressures remained elevated -Imdur 30 mg PO q daily added on 01/31/2016,  with subsequent improvement in blood pressures.   Procedures:  CXR  01/27/2016  Consultations:  Psychiatry; Dr Louretta Shorten 01/28/2016  Discharge Exam: Filed Vitals:   02/04/16 1057 02/04/16 1359  BP: 132/78 109/71  Pulse:  74  Temp:  98.3 F (36.8 C)  Resp:  18    General: NAD Cardiovascular: RRR Respiratory: CTAB  Discharge Instructions   Discharge Instructions    Diet general    Complete by:  As directed      Increase activity slowly    Complete by:  As directed           Current Discharge Medication List    START taking these medications   Details  amLODipine (NORVASC) 10 MG tablet Take 1 tablet (10 mg total) by mouth daily. Qty: 30 tablet, Refills: 2    isosorbide mononitrate (IMDUR) 30 MG 24 hr tablet Take 1 tablet (30 mg total) by mouth daily. Qty: 30 tablet, Refills: 3    oxyCODONE (OXY IR/ROXICODONE) 5 MG immediate release tablet Take 1 tablet (5 mg total) by mouth every 6 (six) hours as needed for breakthrough pain. Qty: 15 tablet, Refills: 0      CONTINUE these medications which have NOT CHANGED   Details  ibuprofen (ADVIL,MOTRIN) 200 MG tablet Take 200-800 mg by mouth every 6 (six) hours as needed for moderate pain.       Allergies  Allergen Reactions  . Codeine Other (See Comments)    Makes her sleepy   Follow-up Information    Please follow up.   Why:  f/u with MD at SNF      Please follow up.   Why:  f/u with psychiatry as outpatient after d/c from SNF       The results of significant diagnostics from this hospitalization (including imaging, microbiology, ancillary and laboratory) are listed below for reference.    Significant Diagnostic Studies: Dg Chest 2 View  01/27/2016  CLINICAL DATA:  55 year old female with a history of weakness fever and cough. Lethargy. EXAM: CHEST - 2 VIEW COMPARISON:  08/11/2004 FINDINGS: Cardiomediastinal silhouette unchanged in size and contour. Interstitial opacities with peribronchial thickening. No confluent airspace disease. No pleural effusion. No pneumothorax. No  displaced fracture. Unremarkable appearance of the upper abdomen. IMPRESSION: Central airway thickening and interstitial opacities, potentially related to acute or acute on chronic bronchitis. No evidence of lobar pneumonia. Signed, Dulcy Fanny. Earleen Newport, DO [facility] Electronically Signed   By: Corrie Mckusick D.O.   On: 01/27/2016 10:03   Dg Hip Unilat With Pelvis 2-3 Views Right  01/30/2016  CLINICAL DATA:  Pt unable to verbalize hx due to mental status (RN states pt has schizophrenia). Indicates posterior RIGHT hip pain. CT EXAM: DG HIP (WITH OR WITHOUT PELVIS) 2-3V RIGHT COMPARISON:  02/09/2012 FINDINGS: There is no evidence of hip fracture or dislocation. Chronic changes identified at the right greater trochanter. IMPRESSION: No evidence for acute  abnormality. Electronically Signed   By: Nolon Nations M.D.   On: 01/30/2016 16:00    Microbiology: Recent Results (from the past 240 hour(s))  Culture, blood (routine x 2) Call MD if unable to obtain prior to antibiotics being given     Status: None   Collection Time: 01/27/16  2:10 PM  Result Value Ref Range Status   Specimen Description BLOOD LEFT ANTECUBITAL  Final   Special Requests BOTTLES DRAWN AEROBIC AND ANAEROBIC 5CC  Final   Culture   Final    NO GROWTH  5 DAYS Performed at [facility]    Report Status 02/01/2016 FINAL  Final  Culture, blood (routine x 2) Call MD if unable to obtain prior to antibiotics being given     Status: None   Collection Time: 01/27/16  2:15 PM  Result Value Ref Range Status   Specimen Description BLOOD RIGHT ANTECUBITAL  Final   Special Requests BOTTLES DRAWN AEROBIC AND ANAEROBIC 5CC  Final   Culture   Final    NO GROWTH 5 DAYS Performed at [facility]    Report Status 02/01/2016 FINAL  Final  MRSA PCR Screening     Status: None   Collection Time: 01/27/16  4:55 PM  Result Value Ref Range Status   MRSA by PCR NEGATIVE NEGATIVE  Final    Comment:        The GeneXpert MRSA Assay (FDA approved for NASAL specimens only), is one component of a comprehensive MRSA colonization surveillance program. It is not intended to diagnose MRSA infection nor to guide or monitor treatment for MRSA infections.      Labs: Basic Metabolic Panel:  Recent Labs Lab 01/30/16 0610 01/31/16 0626  NA 141 142  K 3.6 3.5  CL 105 105  CO2 27 27  GLUCOSE 102* 102*  BUN 7 6  CREATININE 0.61 0.56  CALCIUM 8.1* 8.5*   Liver Function Tests: No results for input(s): AST, ALT, ALKPHOS, BILITOT, PROT, ALBUMIN in the last 168 hours. No results for input(s): LIPASE, AMYLASE in the last 168 hours. No results for input(s): AMMONIA in the last 168 hours. CBC:  Recent Labs Lab 01/30/16 0610 01/31/16 0626  WBC 3.8* 3.3*  HGB 12.7 12.8  HCT 40.5 39.5  MCV 96.0 90.6  PLT 160 169   Cardiac Enzymes: No results for input(s): CKTOTAL, CKMB, CKMBINDEX, TROPONINI in the last 168 hours. BNP: BNP (last 3 results) No results for input(s): BNP in the last 8760 hours.  ProBNP (last 3 results) No results for input(s): PROBNP in the last 8760 hours.  CBG:  Recent Labs Lab 01/31/16 0756  GLUCAP 93       Signed:  THOMPSON,DANIEL MD.  [facility] 02/04/2016, 2:51 PM

## 2016-02-05 ENCOUNTER — Non-Acute Institutional Stay (SKILLED_NURSING_FACILITY): Payer: Medicare Other | Admitting: Adult Health

## 2016-02-05 ENCOUNTER — Encounter: Payer: Self-pay | Admitting: Adult Health

## 2016-02-05 DIAGNOSIS — F200 Paranoid schizophrenia: Secondary | ICD-10-CM

## 2016-02-05 DIAGNOSIS — I10 Essential (primary) hypertension: Secondary | ICD-10-CM | POA: Diagnosis not present

## 2016-02-05 DIAGNOSIS — J101 Influenza due to other identified influenza virus with other respiratory manifestations: Secondary | ICD-10-CM | POA: Diagnosis not present

## 2016-02-05 DIAGNOSIS — M25551 Pain in right hip: Secondary | ICD-10-CM | POA: Diagnosis not present

## 2016-02-05 NOTE — Progress Notes (Signed)
Patient ID: [name], female   DOB: 1961-02-08, 55 y.o.   MRN: [id number]   [facility]       Allergies  Allergen Reactions  . Codeine Other (See Comments)    Makes her sleepy    Chief Complaint  Patient presents with  . Hospitalization Follow-up    Hospital Follow up    HPI:  She has been hospitalized for influenza and possible pneumonia. She has completed therapy for both. She is a somewhat poor historian; does tell me that she is feeling good. There are no nursing concerns at this time.    Past Medical History  Diagnosis Date  . Paranoid schizophrenia (HCC)     History reviewed. No pertinent past surgical history.  History reviewed. No pertinent family history.  Social History   Social History  . Marital Status: Single    Spouse Name: N/A  . Number of Children: N/A  . Years of Education: N/A   Occupational History  . Not on file.   Social History Main Topics  . Smoking status: Current Every Day Smoker -- 0.50 packs/day    Types: Cigarettes  . Smokeless tobacco: Not on file  . Alcohol Use: Yes  . Drug Use: No  . Sexual Activity: Not on file   Other Topics Concern  . Not on file   Social History Narrative     VITAL SIGNS BP 125/91 mmHg  Pulse 88  Temp(Src) 97 F (36.1 C) (Oral)  Resp 21  SpO2 96%  Patient's Medications  New Prescriptions   No medications on file  Previous Medications   AMLODIPINE (NORVASC) 10 MG TABLET    Take 1 tablet (10 mg total) by mouth daily.   IBUPROFEN (ADVIL,MOTRIN) 200 MG TABLET    Take 200-800 mg by mouth every 6 (six) hours as needed for moderate pain.   ISOSORBIDE MONONITRATE (IMDUR) 30 MG 24 HR TABLET    Take 1 tablet (30 mg total) by mouth daily.   OXYCODONE (OXY IR/ROXICODONE) 5 MG IMMEDIATE RELEASE TABLET    Take 1 tablet (5 mg total) by mouth every 6 (six) hours as needed for breakthrough pain.  Modified Medications   No medications on file  Discontinued Medications   No medications on file       SIGNIFICANT DIAGNOSTIC EXAMS  01-27-16: chest x-ray: Central airway thickening and interstitial opacities, potentially related to acute or acute on chronic bronchitis. No evidence of lobar pneumonia.  01-30-16: right hip x-ray: No evidence for acute  abnormality   LABS REVIEWED:   01-27-16: wbc 5.6; hgb 13.0; hct 41.4; mcv 96.1 plt 179; glucose 100; bun 10; creat 0.67; k+ 3.6; na++134; liver normal albumin 3.8; ammonia 19; hgb a1c 6.7; HIV: nr; blood culture: no growth; flu: +A 01-31-16: wbc 3.3; hgb 12.8; hct 39.5; mcv 90.6; plt 169; glucose 102; bun 6; creat 0.56; k+ 3.5; na++142     Review of Systems  Constitutional: Negative for malaise/fatigue.  Respiratory: Negative for cough and shortness of breath.   Cardiovascular: Negative for chest pain, palpitations and leg swelling.  Gastrointestinal: Negative for heartburn, abdominal pain and constipation.  Musculoskeletal: Negative for myalgias, back pain and joint pain.  Skin: Negative.   Neurological: Negative for dizziness.  Psychiatric/Behavioral: The patient is not nervous/anxious.     Physical Exam  Constitutional: No distress.  Eyes: Conjunctivae are normal.  Neck: Neck supple. No JVD present. No thyromegaly present.  Cardiovascular: Normal rate, regular rhythm and intact distal pulses.   Respiratory: Effort  normal and breath sounds normal. No respiratory distress. She has no wheezes.  GI: Soft. Bowel sounds are normal. She exhibits no distension. There is no tenderness.  Musculoskeletal: She exhibits no edema.  Able to move all extremities  Is ambulating with  walker   Lymphadenopathy:    She has no cervical adenopathy.  Neurological: She is alert.  Skin: Skin is warm and dry. She is not diaphoretic.  Psychiatric: She has a normal mood and affect.      ASSESSMENT/ PLAN:  1. Hypertension: will continue norvasc 10 mg daily; and imdur 30 mg daily will monitor  2. Right hip pain related to osteoarthritis: will  continue motrin every 6 hours as needed will continue oxycodone every 6 hours as needed   3. Schizophrenia: is presently not taking medications; she was seen by psych services in the hospital and will require outpatient follow up. Will have facility setup for [facility] to follow her.    Time spent with patient 50    minutes >50% time spent counseling; reviewing medical record; tests; labs; and developing future plan of care    Synthia Innocent NP [facility]  Contact [phone] Monday through Friday 8am- 5pm  After hours call [phone]

## 2016-02-11 ENCOUNTER — Encounter: Payer: Self-pay | Admitting: Internal Medicine

## 2016-02-11 ENCOUNTER — Non-Acute Institutional Stay (SKILLED_NURSING_FACILITY): Payer: Medicare Other | Admitting: Internal Medicine

## 2016-02-11 DIAGNOSIS — I10 Essential (primary) hypertension: Secondary | ICD-10-CM

## 2016-02-11 DIAGNOSIS — M1611 Unilateral primary osteoarthritis, right hip: Secondary | ICD-10-CM

## 2016-02-11 DIAGNOSIS — F200 Paranoid schizophrenia: Secondary | ICD-10-CM

## 2016-02-11 NOTE — Progress Notes (Signed)
Patient ID: [name], female   DOB: May 30, 1961, 55 y.o.   MRN: [id number]    HISTORY AND PHYSICAL   DATE: 02/11/16  Location:  [facility] Room Number: 116-A Place of Service: SNF (31)   Extended Emergency Contact Information Primary Emergency Contact: Smith,Beverly Address: [place], [place] [address] [place] Phone: [phone] Relation: Mother Secondary Emergency Contact: Jozwiak,[name]  [place] Phone: [phone] Mobile Phone: [phone] Relation: Brother  Advanced Directive information  FULL CODE  Chief Complaint  Patient presents with  . New Admit To SNF    New admission    HPI:  56 yo female seen today as a new admission into SNF following hospital stay for Influenza A and right hip pain. CXR neg initially. Influenza PCR (+) for A and she was tx with 5 day course of tamiflu. IV rocpehin and azithromycin --> doxy. She completed 7 days doxy prior to d/c. Psych services followed pt due to c/a noncompliance with psych meds and recommended o/p psyh f/u. Right hip sray obtained --> degenerative changes. BP elevated and she was started 1st on norvasc then imdur. She presents to SNF for short term rehab of right hip.  She reports right hip pain improving with therapy. She uses rolling walker. No falls. No f/c. No cough. She has hallucinations but no SI/HI.  Hypertension - BP stable on norvasc 10 mg daily and imdur 30 mg daily  Right hip pain  - due to OA. Stable on motrin every 6 hours as needed and prn oxycodone   Schizophrenia -  not taking medications at this time  Past Medical History  Diagnosis Date  . Paranoid schizophrenia ([place])     History reviewed. No pertinent past surgical history.  Patient Care Team: Gildardo Cranker, DO as PCP - General (Internal Medicine) [place]  Social History   Social History  . Marital Status:  Single    Spouse Name: N/A  . Number of Children: N/A  . Years of Education: N/A   Occupational History  . Not on file.   Social History Main Topics  . Smoking status: Current Every Day Smoker -- 0.50 packs/day    Types: Cigarettes  . Smokeless tobacco: Not on file  . Alcohol Use: Yes  . Drug Use: No  . Sexual Activity: Not on file   Other Topics Concern  . Not on file   Social History Narrative     reports that she has been smoking Cigarettes.  She has been smoking about 0.50 packs per day. She does not have any smokeless tobacco history on file. She reports that she drinks alcohol. She reports that she does not use illicit drugs.  History reviewed. No pertinent family history. No family status information on file.    Immunization History  Administered Date(s) Administered  . Influenza-Unspecified 11/15/2015  . PPD Test 02/02/2016  . Pneumococcal-Unspecified 11/15/2015    Allergies  Allergen Reactions  . Codeine Other (See Comments)    Makes her sleepy    Medications: Patient's Medications  New Prescriptions   No medications on file  Previous Medications   AMLODIPINE (NORVASC) 10 MG TABLET    Take 1 tablet (10 mg total) by mouth daily.   IBUPROFEN (ADVIL,MOTRIN) 200 MG TABLET    Take 200-800 mg by mouth every 6 (six) hours as needed for moderate pain.  ISOSORBIDE MONONITRATE (IMDUR) 30 MG 24 HR TABLET    Take 1 tablet (30 mg total) by mouth daily.   OXYCODONE (OXY IR/ROXICODONE) 5 MG IMMEDIATE RELEASE TABLET    Take 1 tablet (5 mg total) by mouth every 6 (six) hours as needed for breakthrough pain.  Modified Medications   No medications on file  Discontinued Medications   No medications on file    Review of Systems  Unable to perform ROS: Psychiatric disorder    Filed Vitals:   02/11/16 1043  BP: 116/68  Pulse: 77  Temp: 97.6 F (36.4 C)  TempSrc: Oral  Resp: 22  Height: '5\' 4"'  (1.626 m)  Weight: 185 lb 6 oz (84.086 kg)   Body mass index is 31.8  kg/(m^2).  Physical Exam  Constitutional: She appears well-developed and well-nourished.  Lying in bed in NAD  HENT:  Mouth/Throat: Oropharynx is clear and moist. No oropharyngeal exudate.  Eyes: Pupils are equal, round, and reactive to light. No scleral icterus.  Neck: Neck supple. Carotid bruit is not present. No tracheal deviation present.  Cardiovascular: Normal rate, regular rhythm, normal heart sounds and intact distal pulses.  Exam reveals no gallop and no friction rub.   No murmur heard. No LE edema b/l. no calf TTP.   Pulmonary/Chest: Effort normal and breath sounds normal. No stridor. No respiratory distress. She has no wheezes. She has no rales.  Abdominal: Soft. Bowel sounds are normal. She exhibits no distension and no mass. There is no hepatomegaly. There is no tenderness. There is no rebound and no guarding.  Musculoskeletal: She exhibits edema and tenderness.  Reduced right hip internal rotation  Lymphadenopathy:    She has no cervical adenopathy.  Neurological: She is alert.  Skin: Skin is warm and dry. No rash noted.  Psychiatric: She has a normal mood and affect. She is actively hallucinating (auditory).     Labs reviewed: Nursing Home on 02/05/2016  Component Date Value Ref Range Status  . Hemoglobin A1C 01/27/2016 6.7   Final  Admission on 01/27/2016, Discharged on 02/04/2016  Component Date Value Ref Range Status  . WBC 01/27/2016 5.6  4.0 - 10.5 K/uL Final  . RBC 01/27/2016 4.31  3.87 - 5.11 MIL/uL Final  . Hemoglobin 01/27/2016 13.0  12.0 - 15.0 g/dL Final  . HCT 01/27/2016 41.4  36.0 - 46.0 % Final  . MCV 01/27/2016 96.1  78.0 - 100.0 fL Final  . MCH 01/27/2016 30.2  26.0 - 34.0 pg Final  . MCHC 01/27/2016 31.4  30.0 - 36.0 g/dL Final  . RDW 01/27/2016 12.7  11.5 - 15.5 % Final  . Platelets 01/27/2016 179  150 - 400 K/uL Final  . Neutrophils Relative % 01/27/2016 81   Final  . Neutro Abs 01/27/2016 4.6  1.7 - 7.7 K/uL Final  . Lymphocytes Relative  01/27/2016 7   Final  . Lymphs Abs 01/27/2016 0.4* 0.7 - 4.0 K/uL Final  . Monocytes Relative 01/27/2016 12   Final  . Monocytes Absolute 01/27/2016 0.7  0.1 - 1.0 K/uL Final  . Eosinophils Relative 01/27/2016 0   Final  . Eosinophils Absolute 01/27/2016 0.0  0.0 - 0.7 K/uL Final  . Basophils Relative 01/27/2016 0   Final  . Basophils Absolute 01/27/2016 0.0  0.0 - 0.1 K/uL Final  . Sodium 01/27/2016 134* 135 - 145 mmol/L Final  . Potassium 01/27/2016 3.6  3.5 - 5.1 mmol/L Final  . Chloride 01/27/2016 105  101 - 111 mmol/L Final  .  CO2 01/27/2016 22  22 - 32 mmol/L Final  . Glucose, Bld 01/27/2016 100* 65 - 99 mg/dL Final  . BUN 01/27/2016 10  6 - 20 mg/dL Final  . Creatinine, Ser 01/27/2016 0.67  0.44 - 1.00 mg/dL Final  . Calcium 01/27/2016 8.5* 8.9 - 10.3 mg/dL Final  . Total Protein 01/27/2016 7.0  6.5 - 8.1 g/dL Final  . Albumin 01/27/2016 3.8  3.5 - 5.0 g/dL Final  . AST 01/27/2016 21  15 - 41 U/L Final  . ALT 01/27/2016 16  14 - 54 U/L Final  . Alkaline Phosphatase 01/27/2016 76  38 - 126 U/L Final  . Total Bilirubin 01/27/2016 0.7  0.3 - 1.2 mg/dL Final  . GFR calc non Af Amer 01/27/2016 >60  >60 mL/min Final  . GFR calc Af Amer 01/27/2016 >60  >60 mL/min Final   Comment: (NOTE) The eGFR has been calculated using the CKD EPI equation. This calculation has not been validated in all clinical situations. eGFR's persistently <60 mL/min signify possible Chronic Kidney Disease.   . Anion gap 01/27/2016 7  5 - 15 Final  . Troponin i, poc 01/27/2016 0.03  0.00 - 0.08 ng/mL Final  . Comment 3 01/27/2016          Final   Comment: Due to the release kinetics of cTnI, a negative result within the first hours of the onset of symptoms does not rule out myocardial infarction with certainty. If myocardial infarction is still suspected, repeat the test at appropriate intervals.   . Lactic Acid, Venous 01/27/2016 0.53  0.5 - 2.0 mmol/L Final  . Prothrombin Time 01/27/2016 14.9  11.6 -  15.2 seconds Final  . INR 01/27/2016 1.15  0.00 - 1.49 Final  . Color, Urine 01/28/2016 YELLOW  YELLOW Final  . APPearance 01/28/2016 CLEAR  CLEAR Final  . Specific Gravity, Urine 01/28/2016 1.019  1.005 - 1.030 Final  . pH 01/28/2016 6.0  5.0 - 8.0 Final  . Glucose, UA 01/28/2016 NEGATIVE  NEGATIVE mg/dL Final  . Hgb urine dipstick 01/28/2016 MODERATE* NEGATIVE Final  . Bilirubin Urine 01/28/2016 NEGATIVE  NEGATIVE Final  . Ketones, ur 01/28/2016 NEGATIVE  NEGATIVE mg/dL Final  . Protein, ur 01/28/2016 NEGATIVE  NEGATIVE mg/dL Final  . Nitrite 01/28/2016 NEGATIVE  NEGATIVE Final  . Leukocytes, UA 01/28/2016 NEGATIVE  NEGATIVE Final  . Ammonia 01/27/2016 19  9 - 35 umol/L Final  . Opiates 01/28/2016 NONE DETECTED  NONE DETECTED Final  . Cocaine 01/28/2016 NONE DETECTED  NONE DETECTED Final  . Benzodiazepines 01/28/2016 NONE DETECTED  NONE DETECTED Final  . Amphetamines 01/28/2016 NONE DETECTED  NONE DETECTED Final  . Tetrahydrocannabinol 01/28/2016 NONE DETECTED  NONE DETECTED Final  . Barbiturates 01/28/2016 NONE DETECTED  NONE DETECTED Final   Comment:        DRUG SCREEN FOR MEDICAL PURPOSES ONLY.  IF CONFIRMATION IS NEEDED FOR ANY PURPOSE, NOTIFY LAB WITHIN 5 DAYS.        LOWEST DETECTABLE LIMITS FOR URINE DRUG SCREEN Drug Class       Cutoff (ng/mL) Amphetamine      1000 Barbiturate      200 Benzodiazepine   101 Tricyclics       751 Opiates          300 Cocaine          300 THC              50   . Influenza A By PCR 01/27/2016 POSITIVE* NEGATIVE  Final  . Influenza B By PCR 01/27/2016 NEGATIVE  NEGATIVE Final  . H1N1 flu by pcr 01/27/2016 NOT DETECTED  NOT DETECTED Final   Comment:        The Xpert Flu assay (FDA approved for nasal aspirates or washes and nasopharyngeal swab specimens), is intended as an aid in the diagnosis of influenza and should not be used as a sole basis for treatment. Performed at [facility]   . Lactic Acid, Venous 01/27/2016 0.41*  0.5 - 2.0 mmol/L Final  . Alcohol, Ethyl (B) 01/27/2016 <5  <5 mg/dL Final   Comment:        LOWEST DETECTABLE LIMIT FOR SERUM ALCOHOL IS 5 mg/dL FOR MEDICAL PURPOSES ONLY   . HIV Screen 4th Generation wRfx 01/27/2016 Non Reactive  Non Reactive Final   Comment: (NOTE) Performed At: [facility] [place], [place] [id number] Lindon Romp MD VE:[id number]   . Specimen Description 01/27/2016 BLOOD RIGHT ANTECUBITAL   Final  . Special Requests 01/27/2016 BOTTLES DRAWN AEROBIC AND ANAEROBIC 5CC   Final  . Culture 01/27/2016    Final                   Value:NO GROWTH 5 DAYS Performed at [facility]   . Report Status 01/27/2016 02/01/2016 FINAL   Final  . Specimen Description 01/27/2016 BLOOD LEFT ANTECUBITAL   Final  . Special Requests 01/27/2016 BOTTLES DRAWN AEROBIC AND ANAEROBIC 5CC   Final  . Culture 01/27/2016    Final                   Value:NO GROWTH 5 DAYS Performed at [facility]   . Report Status 01/27/2016 02/01/2016 FINAL   Final  . Strep Pneumo Urinary Antigen 01/28/2016 NEGATIVE  NEGATIVE Final   Comment: Performed at [facility]        Infection due to S. pneumoniae cannot be absolutely ruled out since the antigen present may be below the detection limit of the test. Performed at [facility]   . MRSA by PCR 01/27/2016 NEGATIVE  NEGATIVE Final   Comment:        The GeneXpert MRSA Assay (FDA approved for NASAL specimens only), is one component of a comprehensive MRSA colonization surveillance program. It is not intended to diagnose MRSA infection nor to guide or monitor treatment for MRSA infections.   . Sodium 01/28/2016 139  135 - 145 mmol/L Final  . Potassium 01/28/2016 3.3* 3.5 - 5.1 mmol/L Final  . Chloride 01/28/2016 106  101 - 111 mmol/L Final  . CO2 01/28/2016 24  22 - 32 mmol/L Final  . Glucose, Bld 01/28/2016 80  65 - 99 mg/dL Final  . BUN 01/28/2016 9  6 - 20 mg/dL Final  . Creatinine,  Ser 01/28/2016 0.68  0.44 - 1.00 mg/dL Final  . Calcium 01/28/2016 7.7* 8.9 - 10.3 mg/dL Final  . GFR calc non Af Amer 01/28/2016 >60  >60 mL/min Final  . GFR calc Af Amer 01/28/2016 >60  >60 mL/min Final   Comment: (NOTE) The eGFR has been calculated using the CKD EPI equation. This calculation has not been validated in all clinical situations. eGFR's persistently <60 mL/min signify possible Chronic Kidney Disease.   . Anion gap 01/28/2016 9  5 - 15 Final  . WBC 01/28/2016 3.9* 4.0 - 10.5 K/uL Final  . RBC 01/28/2016 4.12  3.87 - 5.11 MIL/uL Final  . Hemoglobin 01/28/2016 12.5  12.0 - 15.0 g/dL Final  . HCT 01/28/2016 40.3  36.0 - 46.0 % Final  . MCV 01/28/2016 97.8  78.0 - 100.0 fL Final  . MCH 01/28/2016 30.3  26.0 - 34.0 pg Final  . MCHC 01/28/2016 31.0  30.0 - 36.0 g/dL Final  . RDW 01/28/2016 13.0  11.5 - 15.5 % Final  . Platelets 01/28/2016 144* 150 - 400 K/uL Final  . Squamous Epithelial / LPF 01/28/2016 0-5* NONE SEEN Final  . WBC, UA 01/28/2016 0-5  0 - 5 WBC/hpf Final  . RBC / HPF 01/28/2016 6-30  0 - 5 RBC/hpf Final  . Bacteria, UA 01/28/2016 FEW* NONE SEEN Final  . Sodium 01/30/2016 141  135 - 145 mmol/L Final  . Potassium 01/30/2016 3.6  3.5 - 5.1 mmol/L Final  . Chloride 01/30/2016 105  101 - 111 mmol/L Final  . CO2 01/30/2016 27  22 - 32 mmol/L Final  . Glucose, Bld 01/30/2016 102* 65 - 99 mg/dL Final  . BUN 01/30/2016 7  6 - 20 mg/dL Final  . Creatinine, Ser 01/30/2016 0.61  0.44 - 1.00 mg/dL Final  . Calcium 01/30/2016 8.1* 8.9 - 10.3 mg/dL Final  . GFR calc non Af Amer 01/30/2016 >60  >60 mL/min Final  . GFR calc Af Amer 01/30/2016 >60  >60 mL/min Final   Comment: (NOTE) The eGFR has been calculated using the CKD EPI equation. This calculation has not been validated in all clinical situations. eGFR's persistently <60 mL/min signify possible Chronic Kidney Disease.   . Anion gap 01/30/2016 9  5 - 15 Final  . WBC 01/30/2016 3.8* 4.0 - 10.5 K/uL Final  . RBC  01/30/2016 4.22  3.87 - 5.11 MIL/uL Final  . Hemoglobin 01/30/2016 12.7  12.0 - 15.0 g/dL Final  . HCT 01/30/2016 40.5  36.0 - 46.0 % Final  . MCV 01/30/2016 96.0  78.0 - 100.0 fL Final  . MCH 01/30/2016 30.1  26.0 - 34.0 pg Final  . MCHC 01/30/2016 31.4  30.0 - 36.0 g/dL Final  . RDW 01/30/2016 12.8  11.5 - 15.5 % Final  . Platelets 01/30/2016 160  150 - 400 K/uL Final  . Sodium 01/31/2016 142  135 - 145 mmol/L Final  . Potassium 01/31/2016 3.5  3.5 - 5.1 mmol/L Final  . Chloride 01/31/2016 105  101 - 111 mmol/L Final  . CO2 01/31/2016 27  22 - 32 mmol/L Final  . Glucose, Bld 01/31/2016 102* 65 - 99 mg/dL Final  . BUN 01/31/2016 6  6 - 20 mg/dL Final  . Creatinine, Ser 01/31/2016 0.56  0.44 - 1.00 mg/dL Final  . Calcium 01/31/2016 8.5* 8.9 - 10.3 mg/dL Final  . GFR calc non Af Amer 01/31/2016 >60  >60 mL/min Final  . GFR calc Af Amer 01/31/2016 >60  >60 mL/min Final   Comment: (NOTE) The eGFR has been calculated using the CKD EPI equation. This calculation has not been validated in all clinical situations. eGFR's persistently <60 mL/min signify possible Chronic Kidney Disease.   . Anion gap 01/31/2016 10  5 - 15 Final  . WBC 01/31/2016 3.3* 4.0 - 10.5 K/uL Final  . RBC 01/31/2016 4.36  3.87 - 5.11 MIL/uL Final  . Hemoglobin 01/31/2016 12.8  12.0 - 15.0 g/dL Final  . HCT 01/31/2016 39.5  36.0 - 46.0 % Final  . MCV 01/31/2016 90.6  78.0 - 100.0 fL Final  . MCH 01/31/2016 29.4  26.0 - 34.0 pg Final  . MCHC 01/31/2016 32.4  30.0 - 36.0 g/dL Final  . RDW 01/31/2016 12.4  11.5 - 15.5 % Final  . Platelets 01/31/2016 169  150 - 400 K/uL Final  . Glucose-Capillary 01/31/2016 93  65 - 99 mg/dL Final  . Comment 1 01/31/2016 Notify RN   Final    Dg Chest 2 View  01/27/2016  CLINICAL DATA:  55 year old female with a history of weakness fever and cough. Lethargy. EXAM: CHEST - 2 VIEW COMPARISON:  08/11/2004 FINDINGS: Cardiomediastinal silhouette unchanged in size and contour. Interstitial  opacities with peribronchial thickening. No confluent airspace disease. No pleural effusion. No pneumothorax. No displaced fracture. Unremarkable appearance of the upper abdomen. IMPRESSION: Central airway thickening and interstitial opacities, potentially related to acute or acute on chronic bronchitis. No evidence of lobar pneumonia. Signed, Dulcy Fanny. Earleen Newport, DO [facility] Electronically Signed   By: Corrie Mckusick D.O.   On: 01/27/2016 10:03   Dg Hip Unilat With Pelvis 2-3 Views Right  01/30/2016  CLINICAL DATA:  Pt unable to verbalize hx due to mental status (RN states pt has schizophrenia). Indicates posterior RIGHT hip pain. CT EXAM: DG HIP (WITH OR WITHOUT PELVIS) 2-3V RIGHT COMPARISON:  02/09/2012 FINDINGS: There is no evidence of hip fracture or dislocation. Chronic changes identified at the right greater trochanter. IMPRESSION: No evidence for acute  abnormality. Electronically Signed   By: Nolon Nations M.D.   On: 01/30/2016 16:00     Assessment/Plan   ICD-9-CM ICD-10-CM   1. Primary osteoarthritis of right hip 715.15 M16.11   2. Paranoid schizophrenia, chronic condition ([place]) 295.32 F20.0   3. Essential hypertension 401.9 I10     Cont current meds as ordered  PT/OT/ST as ordered  F/u with [facility] psych services as ordered  GOAL: short term rehab and d/c home when medically appropriate. Communicated with pt and nursing.  Will follow  Can Lucci S. Perlie Gold  [facility] [address] [place], [place] [address] [phone] Cell (Monday-Friday 8 AM - 5 PM) [phone] After 5 PM and follow prompts

## 2016-03-03 ENCOUNTER — Non-Acute Institutional Stay (SKILLED_NURSING_FACILITY): Payer: Medicare Other | Admitting: Internal Medicine

## 2016-03-03 ENCOUNTER — Encounter: Payer: Self-pay | Admitting: Internal Medicine

## 2016-03-03 DIAGNOSIS — F200 Paranoid schizophrenia: Secondary | ICD-10-CM | POA: Diagnosis not present

## 2016-03-03 DIAGNOSIS — I10 Essential (primary) hypertension: Secondary | ICD-10-CM | POA: Diagnosis not present

## 2016-03-03 DIAGNOSIS — M1611 Unilateral primary osteoarthritis, right hip: Secondary | ICD-10-CM

## 2016-03-03 NOTE — Progress Notes (Signed)
Patient ID: [name], female   DOB: [birth year], 55 y.o.   MRN: [id number]    DATE: 03/03/16  Location:  [facility] Room Number: 116 Place of Service: SNF (31)   Extended Emergency Contact Information Primary Emergency Contact: Smith,Beverly Address: [address]          [place], [place] [address] [place] Phone: [phone] Relation: Mother Secondary Emergency Contact: Sandate,[name]  [place] Phone: [phone] Mobile Phone: [phone] Relation: Brother  Advanced Directive information Does patient have an advance directive?: No, Would patient like information on creating an advanced directive?: No - patient declined information  Chief Complaint  Patient presents with  . Discharge Note    HPI:  55 yo female seen today for d/c from SNF following short term rehab for right hip pain due to OA. She reports pain mucj improved since admission to SNF. She tolerated PT/OT. She has a complicated d/c due to she is homeless and her family is not supportive. She will be d/c'd to extended stay hotel x 1 week with [facility] PT/OT. She will need DME rolling walker due to hip OA. SW obtaining PCP appt prior to d/c. She is a poor historian due to schizophrenia. Hx obtained from chart. No nursing issues. No falls. No f/c. No cough. She has hallucinations but no SI/HI.  BRIEF SUMMARY OF HOSPITAL STAY:  Influenza A and right hip pain. CXR neg initially. Influenza PCR (+) for A and she was tx with 5 day course of tamiflu. IV rocpehin and azithromycin --> doxy. She completed 7 days doxy prior to d/c. Psych services followed pt due to c/a noncompliance with psych meds and recommended o/p psyh f/u. Right hip sray obtained --> degenerative changes. BP elevated and she was started 1st on norvasc then imdur.    No falls. No f/c. No cough. She has hallucinations but no SI/HI.  Hypertension - BP stable on norvasc 10 mg daily and imdur 30 mg  daily  Right hip pain  - due to OA. Stable on motrin every 6 hours as needed and prn oxycodone   Schizophrenia -  She was started on risperdal by psych services. Dose increased 2 days ago  Past Medical History  Diagnosis Date  . Paranoid schizophrenia ([place])     History reviewed. No pertinent past surgical history.  Patient Care Team: Gildardo Cranker, DO as PCP - General (Internal Medicine) [place] ([place])  Social History   Social History  . Marital Status: Single    Spouse Name: N/A  . Number of Children: N/A  . Years of Education: N/A   Occupational History  . Not on file.   Social History Main Topics  . Smoking status: Current Every Day Smoker -- 0.50 packs/day    Types: Cigarettes  . Smokeless tobacco: Not on file  . Alcohol Use: Yes  . Drug Use: No  . Sexual Activity: Not on file   Other Topics Concern  . Not on file   Social History Narrative     reports that she has been smoking Cigarettes.  She has been smoking about 0.50 packs per day. She does not have any smokeless tobacco history on file. She reports that she drinks alcohol. She reports that she does not use illicit drugs.  Immunization History  Administered Date(s) Administered  . Influenza-Unspecified 11/15/2015  . PPD Test 02/02/2016  . Pneumococcal-Unspecified 11/15/2015    Allergies  Allergen  Reactions  . Codeine Other (See Comments)    Makes her sleepy    Medications: Patient's Medications  New Prescriptions   No medications on file  Previous Medications   AMLODIPINE (NORVASC) 10 MG TABLET    Take 1 tablet (10 mg total) by mouth daily.   IBUPROFEN (ADVIL) 200 MG TABLET    200 mg. Take 2 tablets (400 mg) by mouth every 6 hours as needed for mild pain. Take 3 tablets (600 mg) by mouth every 6 hours as needed for severe pain.   ISOSORBIDE MONONITRATE (IMDUR) 30 MG 24 HR TABLET    Take 1 tablet (30 mg total) by mouth daily.   OXYCODONE (OXY  IR/ROXICODONE) 5 MG IMMEDIATE RELEASE TABLET    Take 1 tablet (5 mg total) by mouth every 6 (six) hours as needed for breakthrough pain.   RISPERIDONE (RISPERDAL) 1 MG TABLET    Take 1 mg by mouth at bedtime.  Modified Medications   No medications on file  Discontinued Medications   IBUPROFEN (ADVIL,MOTRIN) 200 MG TABLET    Take 200-800 mg by mouth every 6 (six) hours as needed for moderate pain.    Review of Systems  Unable to perform ROS: Psychiatric disorder    Filed Vitals:   03/03/16 0946  BP: 129/65  Pulse: 56  Temp: 97.3 F (36.3 C)  TempSrc: Oral  Resp: 18  Height: _0  (1.626 m)  Weight: 191 lb (86.637 kg)   Body mass index is 32.77 kg/(m^2).  Physical Exam  Constitutional: She appears well-developed and well-nourished. No distress.  Neurological: She is alert.  Skin: Skin is warm and dry. No rash noted.  Psychiatric: She has a normal mood and affect. Her speech is normal. She is aggressive. Thought content is delusional.     Labs reviewed: Nursing Home on 02/05/2016  Component Date Value Ref Range Status  . Hemoglobin A1C 01/27/2016 6.7   Final  Admission on 01/27/2016, Discharged on 02/04/2016  Component Date Value Ref Range Status  . WBC 01/27/2016 5.6  4.0 - 10.5 K/uL Final  . RBC 01/27/2016 4.31  3.87 - 5.11 MIL/uL Final  . Hemoglobin 01/27/2016 13.0  12.0 - 15.0 g/dL Final  . HCT 01/27/2016 41.4  36.0 - 46.0 % Final  . MCV 01/27/2016 96.1  78.0 - 100.0 fL Final  . MCH 01/27/2016 30.2  26.0 - 34.0 pg Final  . MCHC 01/27/2016 31.4  30.0 - 36.0 g/dL Final  . RDW 01/27/2016 12.7  11.5 - 15.5 % Final  . Platelets 01/27/2016 179  150 - 400 K/uL Final  . Neutrophils Relative % 01/27/2016 81   Final  . Neutro Abs 01/27/2016 4.6  1.7 - 7.7 K/uL Final  . Lymphocytes Relative 01/27/2016 7   Final  . Lymphs Abs 01/27/2016 0.4* 0.7 - 4.0 K/uL Final  . Monocytes Relative 01/27/2016 12   Final  . Monocytes Absolute 01/27/2016 0.7  0.1 - 1.0 K/uL Final  .  Eosinophils Relative 01/27/2016 0   Final  . Eosinophils Absolute 01/27/2016 0.0  0.0 - 0.7 K/uL Final  . Basophils Relative 01/27/2016 0   Final  . Basophils Absolute 01/27/2016 0.0  0.0 - 0.1 K/uL Final  . Sodium 01/27/2016 134* 135 - 145 mmol/L Final  . Potassium 01/27/2016 3.6  3.5 - 5.1 mmol/L Final  . Chloride 01/27/2016 105  101 - 111 mmol/L Final  . CO2 01/27/2016 22  22 - 32 mmol/L Final  . Glucose, Bld 01/27/2016 100* 65 -  99 mg/dL Final  . BUN 01/27/2016 10  6 - 20 mg/dL Final  . Creatinine, Ser 01/27/2016 0.67  0.44 - 1.00 mg/dL Final  . Calcium 01/27/2016 8.5* 8.9 - 10.3 mg/dL Final  . Total Protein 01/27/2016 7.0  6.5 - 8.1 g/dL Final  . Albumin 01/27/2016 3.8  3.5 - 5.0 g/dL Final  . AST 01/27/2016 21  15 - 41 U/L Final  . ALT 01/27/2016 16  14 - 54 U/L Final  . Alkaline Phosphatase 01/27/2016 76  38 - 126 U/L Final  . Total Bilirubin 01/27/2016 0.7  0.3 - 1.2 mg/dL Final  . GFR calc non Af Amer 01/27/2016 >60  >60 mL/min Final  . GFR calc Af Amer 01/27/2016 >60  >60 mL/min Final   Comment: (NOTE) The eGFR has been calculated using the CKD EPI equation. This calculation has not been validated in all clinical situations. eGFR's persistently <60 mL/min signify possible Chronic Kidney Disease.   . Anion gap 01/27/2016 7  5 - 15 Final  . Troponin i, poc 01/27/2016 0.03  0.00 - 0.08 ng/mL Final  . Comment 3 01/27/2016          Final   Comment: Due to the release kinetics of cTnI, a negative result within the first hours of the onset of symptoms does not rule out myocardial infarction with certainty. If myocardial infarction is still suspected, repeat the test at appropriate intervals.   . Lactic Acid, Venous 01/27/2016 0.53  0.5 - 2.0 mmol/L Final  . Prothrombin Time 01/27/2016 14.9  11.6 - 15.2 seconds Final  . INR 01/27/2016 1.15  0.00 - 1.49 Final  . Color, Urine 01/28/2016 YELLOW  YELLOW Final  . APPearance 01/28/2016 CLEAR  CLEAR Final  . Specific Gravity,  Urine 01/28/2016 1.019  1.005 - 1.030 Final  . pH 01/28/2016 6.0  5.0 - 8.0 Final  . Glucose, UA 01/28/2016 NEGATIVE  NEGATIVE mg/dL Final  . Hgb urine dipstick 01/28/2016 MODERATE* NEGATIVE Final  . Bilirubin Urine 01/28/2016 NEGATIVE  NEGATIVE Final  . Ketones, ur 01/28/2016 NEGATIVE  NEGATIVE mg/dL Final  . Protein, ur 01/28/2016 NEGATIVE  NEGATIVE mg/dL Final  . Nitrite 01/28/2016 NEGATIVE  NEGATIVE Final  . Leukocytes, UA 01/28/2016 NEGATIVE  NEGATIVE Final  . Ammonia 01/27/2016 19  9 - 35 umol/L Final  . Opiates 01/28/2016 NONE DETECTED  NONE DETECTED Final  . Cocaine 01/28/2016 NONE DETECTED  NONE DETECTED Final  . Benzodiazepines 01/28/2016 NONE DETECTED  NONE DETECTED Final  . Amphetamines 01/28/2016 NONE DETECTED  NONE DETECTED Final  . Tetrahydrocannabinol 01/28/2016 NONE DETECTED  NONE DETECTED Final  . Barbiturates 01/28/2016 NONE DETECTED  NONE DETECTED Final   Comment:        DRUG SCREEN FOR MEDICAL PURPOSES ONLY.  IF CONFIRMATION IS NEEDED FOR ANY PURPOSE, NOTIFY LAB WITHIN 5 DAYS.        LOWEST DETECTABLE LIMITS FOR URINE DRUG SCREEN Drug Class       Cutoff (ng/mL) Amphetamine      1000 Barbiturate      200 Benzodiazepine   599 Tricyclics       357 Opiates          300 Cocaine          300 THC              50   . Influenza A By PCR 01/27/2016 POSITIVE* NEGATIVE Final  . Influenza B By PCR 01/27/2016 NEGATIVE  NEGATIVE Final  . H1N1 flu by  pcr 01/27/2016 NOT DETECTED  NOT DETECTED Final   Comment:        The Xpert Flu assay (FDA approved for nasal aspirates or washes and nasopharyngeal swab specimens), is intended as an aid in the diagnosis of influenza and should not be used as a sole basis for treatment. Performed at [facility]   . Lactic Acid, Venous 01/27/2016 0.41* 0.5 - 2.0 mmol/L Final  . Alcohol, Ethyl (B) 01/27/2016 <5  <5 mg/dL Final   Comment:        LOWEST DETECTABLE LIMIT FOR SERUM ALCOHOL IS 5 mg/dL FOR MEDICAL PURPOSES ONLY    . HIV Screen 4th Generation wRfx 01/27/2016 Non Reactive  Non Reactive Final   Comment: (NOTE) Performed At: [facility] [place], [place] [id number] Lindon Romp MD YW:[id number]   . Specimen Description 01/27/2016 BLOOD RIGHT ANTECUBITAL   Final  . Special Requests 01/27/2016 BOTTLES DRAWN AEROBIC AND ANAEROBIC 5CC   Final  . Culture 01/27/2016    Final                   Value:NO GROWTH 5 DAYS Performed at [facility]   . Report Status 01/27/2016 02/01/2016 FINAL   Final  . Specimen Description 01/27/2016 BLOOD LEFT ANTECUBITAL   Final  . Special Requests 01/27/2016 BOTTLES DRAWN AEROBIC AND ANAEROBIC 5CC   Final  . Culture 01/27/2016    Final                   Value:NO GROWTH 5 DAYS Performed at [facility]   . Report Status 01/27/2016 02/01/2016 FINAL   Final  . Strep Pneumo Urinary Antigen 01/28/2016 NEGATIVE  NEGATIVE Final   Comment: Performed at [facility]        Infection due to S. pneumoniae cannot be absolutely ruled out since the antigen present may be below the detection limit of the test. Performed at [facility]   . MRSA by PCR 01/27/2016 NEGATIVE  NEGATIVE Final   Comment:        The GeneXpert MRSA Assay (FDA approved for NASAL specimens only), is one component of a comprehensive MRSA colonization surveillance program. It is not intended to diagnose MRSA infection nor to guide or monitor treatment for MRSA infections.   . Sodium 01/28/2016 139  135 - 145 mmol/L Final  . Potassium 01/28/2016 3.3* 3.5 - 5.1 mmol/L Final  . Chloride 01/28/2016 106  101 - 111 mmol/L Final  . CO2 01/28/2016 24  22 - 32 mmol/L Final  . Glucose, Bld 01/28/2016 80  65 - 99 mg/dL Final  . BUN 01/28/2016 9  6 - 20 mg/dL Final  . Creatinine, Ser 01/28/2016 0.68  0.44 - 1.00 mg/dL Final  . Calcium 01/28/2016 7.7* 8.9 - 10.3 mg/dL Final  . GFR calc non Af Amer 01/28/2016 >60  >60 mL/min Final  . GFR calc Af Amer  01/28/2016 >60  >60 mL/min Final   Comment: (NOTE) The eGFR has been calculated using the CKD EPI equation. This calculation has not been validated in all clinical situations. eGFR's persistently <60 mL/min signify possible Chronic Kidney Disease.   . Anion gap 01/28/2016 9  5 - 15 Final  . WBC 01/28/2016 3.9* 4.0 - 10.5 K/uL Final  . RBC 01/28/2016 4.12  3.87 - 5.11 MIL/uL Final  . Hemoglobin 01/28/2016 12.5  12.0 - 15.0 g/dL Final  . HCT 01/28/2016 40.3  36.0 - 46.0 % Final  .  MCV 01/28/2016 97.8  78.0 - 100.0 fL Final  . MCH 01/28/2016 30.3  26.0 - 34.0 pg Final  . MCHC 01/28/2016 31.0  30.0 - 36.0 g/dL Final  . RDW 01/28/2016 13.0  11.5 - 15.5 % Final  . Platelets 01/28/2016 144* 150 - 400 K/uL Final  . Squamous Epithelial / LPF 01/28/2016 0-5* NONE SEEN Final  . WBC, UA 01/28/2016 0-5  0 - 5 WBC/hpf Final  . RBC / HPF 01/28/2016 6-30  0 - 5 RBC/hpf Final  . Bacteria, UA 01/28/2016 FEW* NONE SEEN Final  . Sodium 01/30/2016 141  135 - 145 mmol/L Final  . Potassium 01/30/2016 3.6  3.5 - 5.1 mmol/L Final  . Chloride 01/30/2016 105  101 - 111 mmol/L Final  . CO2 01/30/2016 27  22 - 32 mmol/L Final  . Glucose, Bld 01/30/2016 102* 65 - 99 mg/dL Final  . BUN 01/30/2016 7  6 - 20 mg/dL Final  . Creatinine, Ser 01/30/2016 0.61  0.44 - 1.00 mg/dL Final  . Calcium 01/30/2016 8.1* 8.9 - 10.3 mg/dL Final  . GFR calc non Af Amer 01/30/2016 >60  >60 mL/min Final  . GFR calc Af Amer 01/30/2016 >60  >60 mL/min Final   Comment: (NOTE) The eGFR has been calculated using the CKD EPI equation. This calculation has not been validated in all clinical situations. eGFR's persistently <60 mL/min signify possible Chronic Kidney Disease.   . Anion gap 01/30/2016 9  5 - 15 Final  . WBC 01/30/2016 3.8* 4.0 - 10.5 K/uL Final  . RBC 01/30/2016 4.22  3.87 - 5.11 MIL/uL Final  . Hemoglobin 01/30/2016 12.7  12.0 - 15.0 g/dL Final  . HCT 01/30/2016 40.5  36.0 - 46.0 % Final  . MCV 01/30/2016 96.0  78.0 -  100.0 fL Final  . MCH 01/30/2016 30.1  26.0 - 34.0 pg Final  . MCHC 01/30/2016 31.4  30.0 - 36.0 g/dL Final  . RDW 01/30/2016 12.8  11.5 - 15.5 % Final  . Platelets 01/30/2016 160  150 - 400 K/uL Final  . Sodium 01/31/2016 142  135 - 145 mmol/L Final  . Potassium 01/31/2016 3.5  3.5 - 5.1 mmol/L Final  . Chloride 01/31/2016 105  101 - 111 mmol/L Final  . CO2 01/31/2016 27  22 - 32 mmol/L Final  . Glucose, Bld 01/31/2016 102* 65 - 99 mg/dL Final  . BUN 01/31/2016 6  6 - 20 mg/dL Final  . Creatinine, Ser 01/31/2016 0.56  0.44 - 1.00 mg/dL Final  . Calcium 01/31/2016 8.5* 8.9 - 10.3 mg/dL Final  . GFR calc non Af Amer 01/31/2016 >60  >60 mL/min Final  . GFR calc Af Amer 01/31/2016 >60  >60 mL/min Final   Comment: (NOTE) The eGFR has been calculated using the CKD EPI equation. This calculation has not been validated in all clinical situations. eGFR's persistently <60 mL/min signify possible Chronic Kidney Disease.   . Anion gap 01/31/2016 10  5 - 15 Final  . WBC 01/31/2016 3.3* 4.0 - 10.5 K/uL Final  . RBC 01/31/2016 4.36  3.87 - 5.11 MIL/uL Final  . Hemoglobin 01/31/2016 12.8  12.0 - 15.0 g/dL Final  . HCT 01/31/2016 39.5  36.0 - 46.0 % Final  . MCV 01/31/2016 90.6  78.0 - 100.0 fL Final  . MCH 01/31/2016 29.4  26.0 - 34.0 pg Final  . MCHC 01/31/2016 32.4  30.0 - 36.0 g/dL Final  . RDW 01/31/2016 12.4  11.5 - 15.5 % Final  .  Platelets 01/31/2016 169  150 - 400 K/uL Final  . Glucose-Capillary 01/31/2016 93  65 - 99 mg/dL Final  . Comment 1 01/31/2016 Notify RN   Final    No results found.   Assessment/Plan    ICD-9-CM ICD-10-CM   1. Primary osteoarthritis of right hip - pain improved 715.15 M16.11   2. Essential hypertension 401.9 I10   3. Paranoid schizophrenia, chronic condition ([place]) 295.32 F20.0    Patient is being discharged with home health services:  PT/OT  Patient is being discharged with the following durable medical equipment:  Rolling walker  Patient has been  advised to f/u with their PCP in 1-2 weeks to bring them up to date on their rehab stay.  They were provided with a 30 day supply of scripts for prescription medications and refills must be obtained from their PCP.  TIME SPENT (MINUTES): [place]. Perlie Gold  [facility] [address] [place], [place] [address] [phone] Cell (Monday-Friday 8 AM - 5 PM) [phone] After 5 PM and follow prompts

## 2016-03-26 IMAGING — CR DG CHEST 2V
2 series · 2 of 2 positions shown · non-contrast
Comparison: 08/11/2004

CLINICAL DATA: 54-year-old female with a history of weakness fever
and cough. Lethargy.

EXAM:
CHEST - 2 VIEW

[w chest pa]
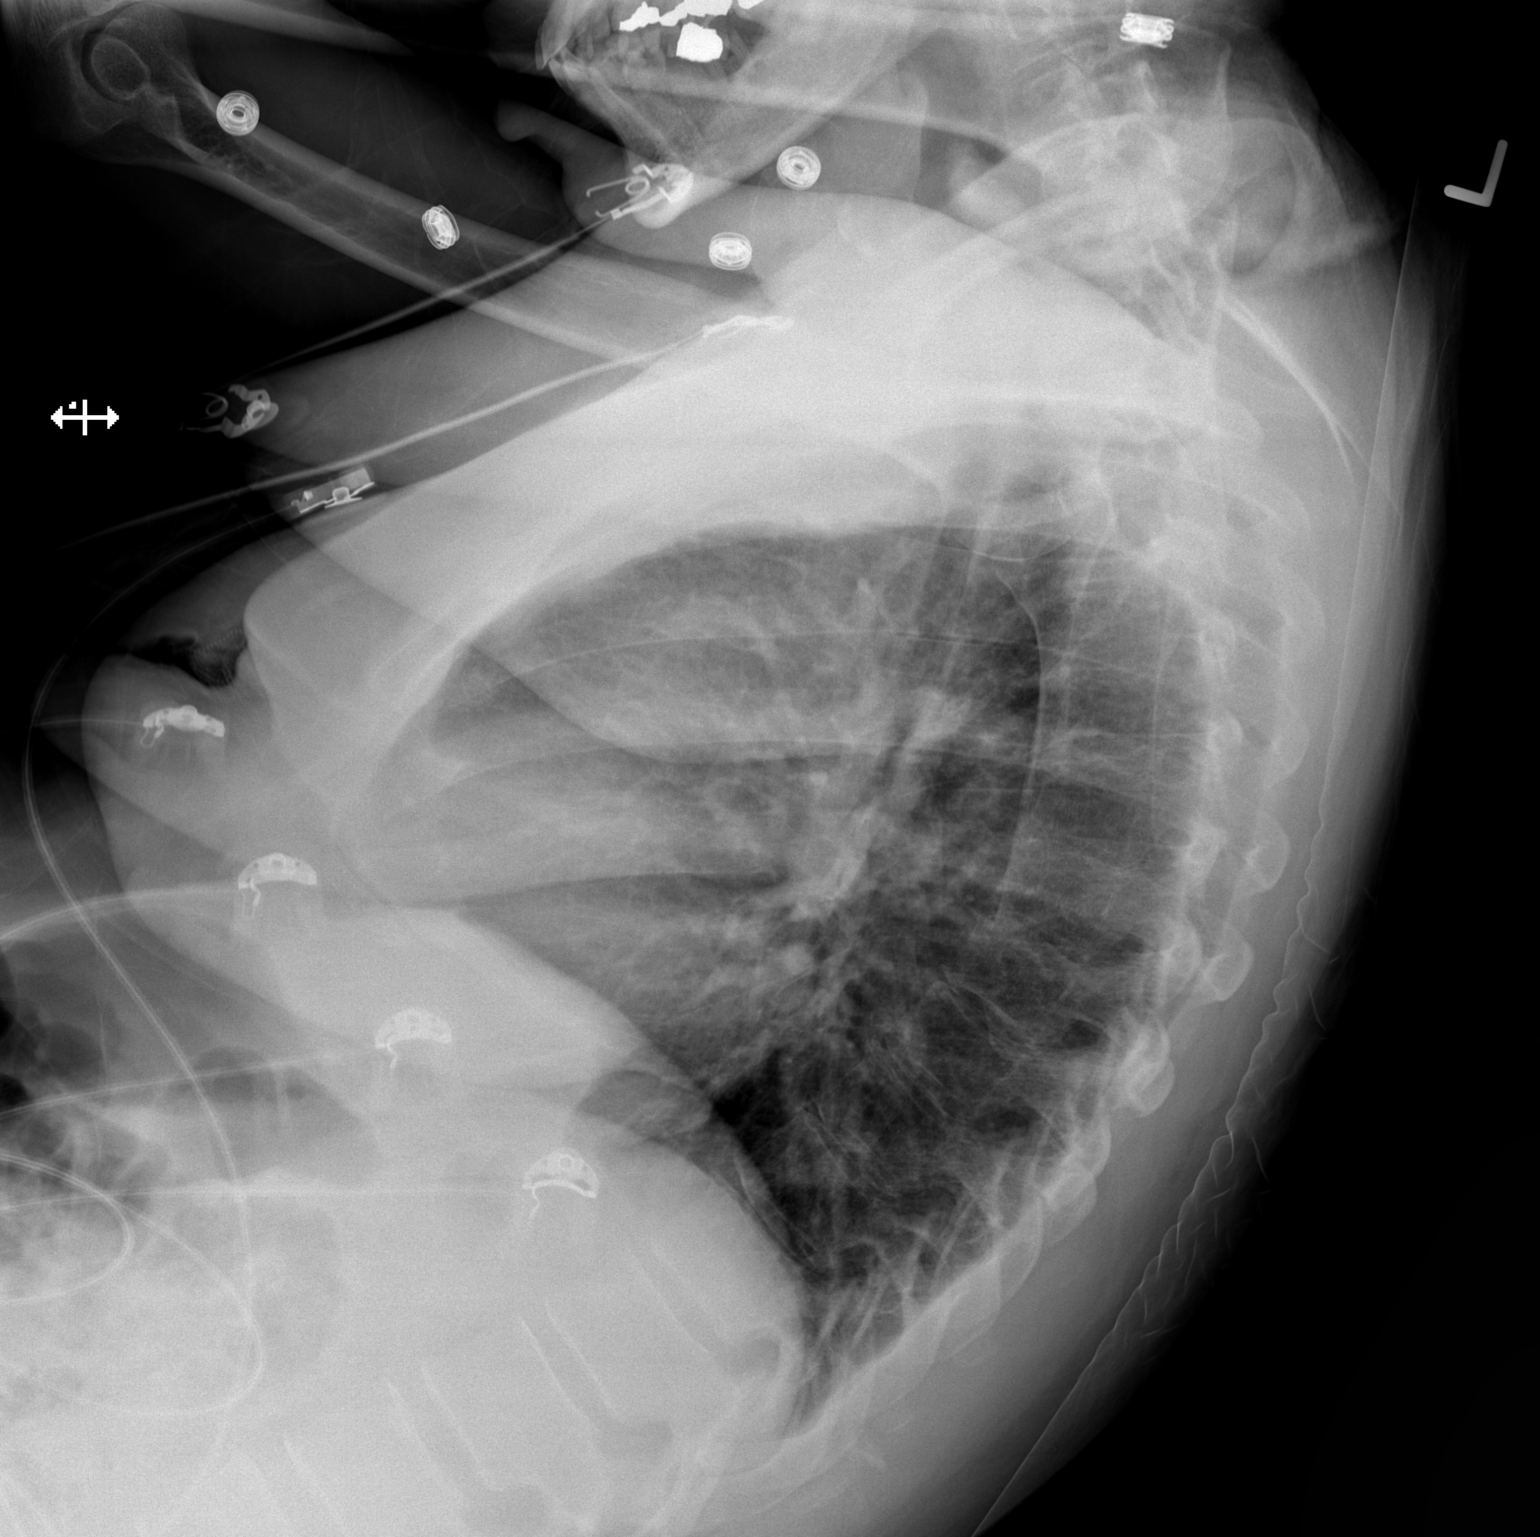

[x chest ap]
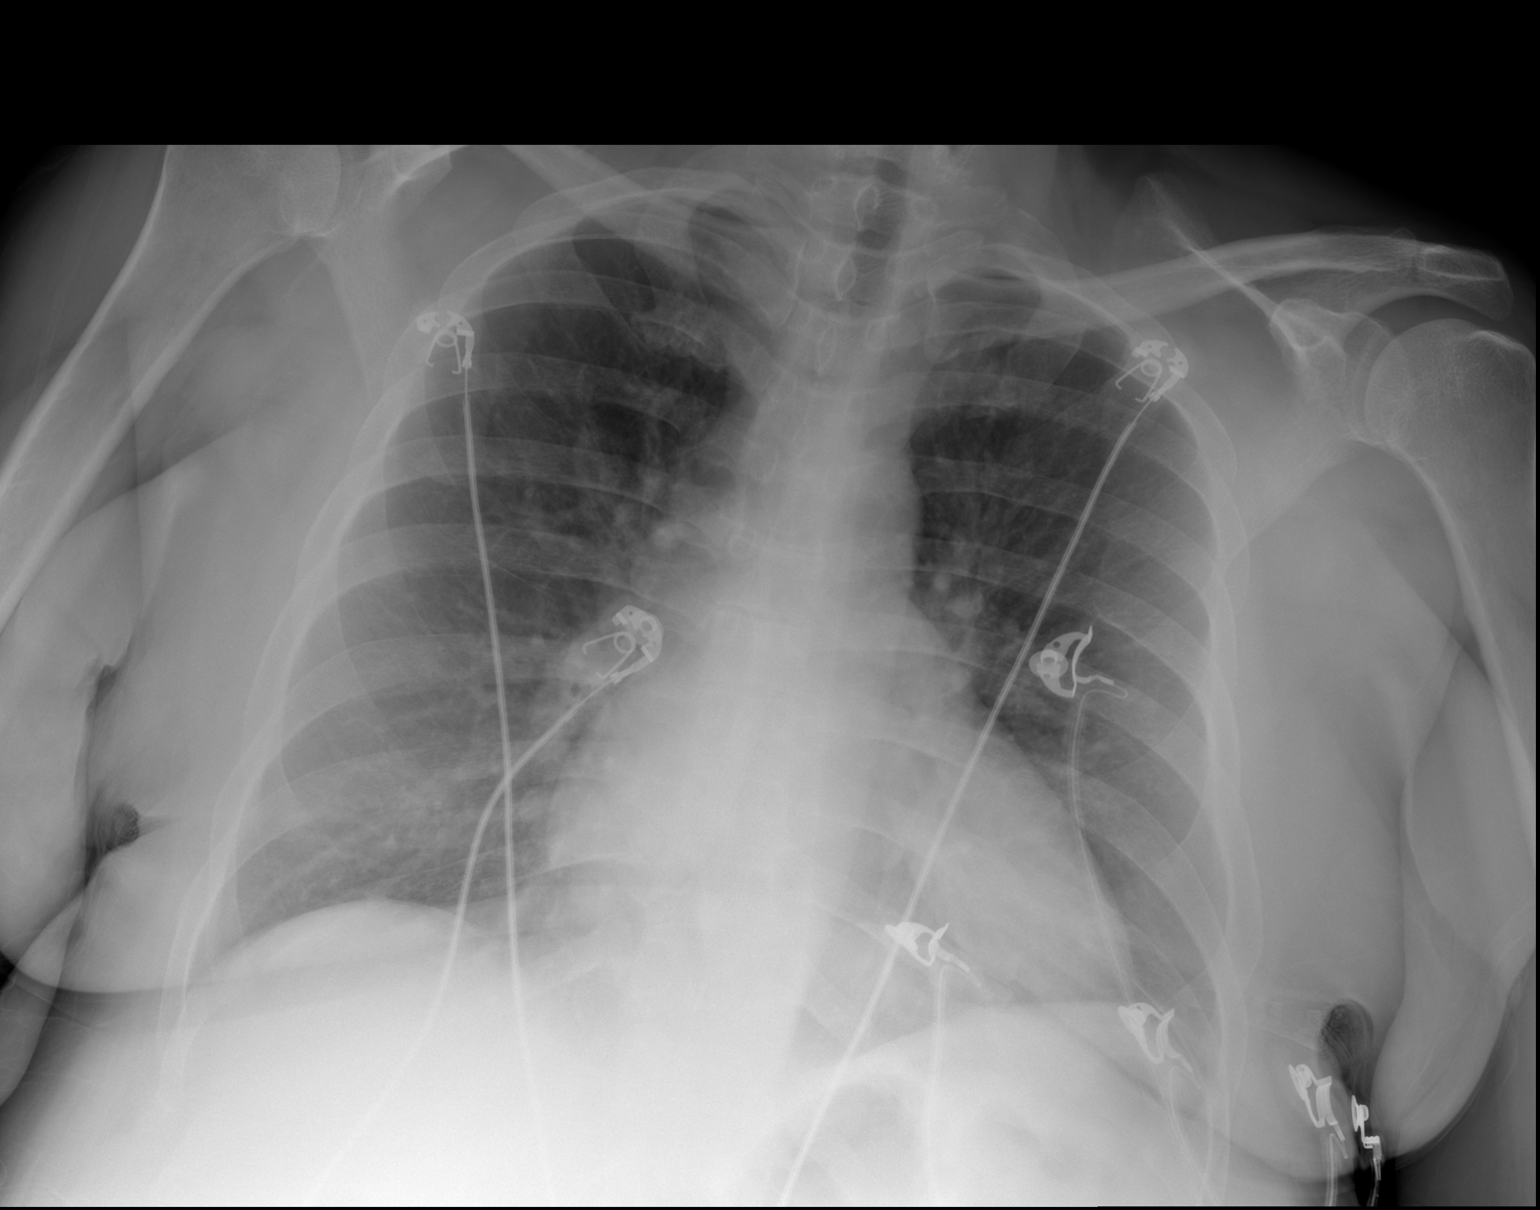

[2 of 2 positions shown; findings below may reference images not displayed]

FINDINGS: Cardiomediastinal silhouette unchanged in size and contour.

Interstitial opacities with peribronchial thickening. No confluent
airspace disease. No pleural effusion. No pneumothorax.

No displaced fracture.

Unremarkable appearance of the upper abdomen.
IMPRESSION: Central airway thickening and interstitial opacities, potentially
related to acute or acute on chronic bronchitis. No evidence of
lobar pneumonia.

## 2016-04-04 ENCOUNTER — Ambulatory Visit: Payer: Medicare Other | Admitting: Family

## 2016-04-04 DIAGNOSIS — Z0289 Encounter for other administrative examinations: Secondary | ICD-10-CM

## 2016-05-05 ENCOUNTER — Ambulatory Visit: Payer: Medicare Other | Admitting: Family

## 2016-05-05 DIAGNOSIS — Z0289 Encounter for other administrative examinations: Secondary | ICD-10-CM

## 2016-09-18 ENCOUNTER — Emergency Department (HOSPITAL_COMMUNITY)
Admission: EM | Admit: 2016-09-18 | Discharge: 2016-09-18 | Disposition: A | Discharge status: Home / Self Care | Payer: Medicare Other | Attending: Emergency Medicine | Admitting: Emergency Medicine

## 2016-09-18 ENCOUNTER — Encounter (HOSPITAL_COMMUNITY): Payer: Self-pay | Admitting: *Deleted

## 2016-09-18 DIAGNOSIS — J40 Bronchitis, not specified as acute or chronic: Secondary | ICD-10-CM | POA: Insufficient documentation

## 2016-09-18 DIAGNOSIS — R062 Wheezing: Secondary | ICD-10-CM | POA: Diagnosis not present

## 2016-09-18 DIAGNOSIS — Z79899 Other long term (current) drug therapy: Secondary | ICD-10-CM | POA: Insufficient documentation

## 2016-09-18 DIAGNOSIS — I10 Essential (primary) hypertension: Secondary | ICD-10-CM | POA: Diagnosis not present

## 2016-09-18 DIAGNOSIS — F17210 Nicotine dependence, cigarettes, uncomplicated: Secondary | ICD-10-CM | POA: Insufficient documentation

## 2016-09-18 DIAGNOSIS — J069 Acute upper respiratory infection, unspecified: Secondary | ICD-10-CM | POA: Diagnosis present

## 2016-09-18 MED ORDER — FLUTICASONE PROPIONATE 50 MCG/ACT NA SUSP
2.0000 | Freq: Every day | NASAL | Status: DC
  Filled 2016-09-18: qty 16

## 2016-09-18 MED ORDER — ALBUTEROL SULFATE (2.5 MG/3ML) 0.083% IN NEBU
5.0000 mg | INHALATION_SOLUTION | Freq: Once | RESPIRATORY_TRACT | Status: DC
Start: 2016-09-18 — End: 2016-09-18
  Filled 2016-09-18: qty 6

## 2016-09-18 MED ORDER — ALBUTEROL SULFATE HFA 108 (90 BASE) MCG/ACT IN AERS
2.0000 | INHALATION_SPRAY | RESPIRATORY_TRACT | Status: DC | PRN
  Filled 2016-09-18: qty 6.7

## 2016-09-18 MED ORDER — DEXAMETHASONE SODIUM PHOSPHATE 10 MG/ML IJ SOLN
10.0000 mg | Freq: Once | INTRAMUSCULAR | Status: DC
  Filled 2016-09-18: qty 1

## 2016-09-18 MED ORDER — AZITHROMYCIN 250 MG PO TABS: 250.0000 mg | ORAL_TABLET | Freq: Every day | ORAL | 0 refills | Status: DC

## 2016-09-18 MED ORDER — AEROCHAMBER PLUS FLO-VU MEDIUM MISC
1.0000 | Freq: Once | Status: DC
  Filled 2016-09-18: qty 1

## 2016-09-18 NOTE — ED Provider Notes (Signed)
[facility]-EMERGENCY DEPT Provider Note   CSN: [id number] Arrival date & time: 09/18/16  0004     History   Chief Complaint Chief Complaint  Patient presents with  . URI    HPI [name] is a 55 y.o. female with a hx of Homelessness, schizophrenia presents to the Emergency Department complaining of gradual, persistent, progressively worsening rhinorrhea, bilateral ear pain and cough onset 2 days ago. Associated symptoms include wheezing. Patient is a current everyday smoker. No treatments prior to arrival. No aggravating or alleviating factors.  Patient denies fever, chills, headache, neck pain, chest pain, abdominal pain, nausea, vomiting, diarrhea. She states "I needed to come before this cold got bad."      The history is provided by the patient and medical records. No language interpreter was used.    Past Medical History:  Diagnosis Date  . Paranoid schizophrenia [facility](HCC)     Patient Active Problem List   Diagnosis Date Noted  . CAP (community acquired pneumonia)   . Influenza A   . Essential hypertension   . Right hip pain   . Paranoid schizophrenia, chronic condition (HCC) 01/28/2016  . Non-compliance with treatment 01/28/2016  . Acute respiratory failure with hypoxia (HCC) 01/27/2016  . Lobar pneumonia, unspecified organism (HCC) 01/27/2016  . History of schizophrenia 01/27/2016    History reviewed. No pertinent surgical history.  OB History    No data available       Home Medications    Prior to Admission medications   Medication Sig Start Date End Date Taking? Authorizing Provider  amLODipine (NORVASC) 10 MG tablet Take 1 tablet (10 mg total) by mouth daily. 02/04/16   Rodolph Bonganiel V [place], MD  ibuprofen (ADVIL) 200 MG tablet 200 mg. Take 2 tablets (400 mg) by mouth every 6 hours as needed for mild pain. Take 3 tablets (600 mg) by mouth every 6 hours as needed for severe pain.    Historical Provider, MD  isosorbide mononitrate (IMDUR) 30 MG 24 hr tablet Take 1  tablet (30 mg total) by mouth daily. 02/04/16   Rodolph Bonganiel V [place], MD  oxyCODONE (OXY IR/ROXICODONE) 5 MG immediate release tablet Take 1 tablet (5 mg total) by mouth every 6 (six) hours as needed for breakthrough pain. 02/04/16   Rodolph Bonganiel V [place], MD  risperiDONE (RISPERDAL) 1 MG tablet Take 1 mg by mouth at bedtime.    Historical Provider, MD    Family History No family history on file.  Social History Social History  Substance Use Topics  . Smoking status: Current Every Day Smoker    Packs/day: 0.50    Types: Cigarettes  . Smokeless tobacco: Never Used  . Alcohol use Yes     Allergies   Codeine   Review of Systems Review of Systems  HENT: Positive for congestion, ear pain ( Bilateral), postnasal drip, rhinorrhea and sinus pressure.   Respiratory: Positive for cough.   All other systems reviewed and are negative.    Physical Exam Updated Vital Signs BP 109/95 (BP Location: Left Arm)   Pulse 93   Temp 97.6 F (36.4 C) (Oral)   Resp 16   Ht 5\' 4"  (1.626 m)   Wt 90.7 kg   SpO2 98%   BMI 34.33 kg/m   Physical Exam  Constitutional: She appears well-developed and well-nourished. No distress.  HENT:  Head: Normocephalic and atraumatic.  Right Ear: Tympanic membrane, external ear and ear canal normal.  Left Ear: Tympanic membrane, external ear and ear canal normal.  Nose: Mucosal edema and rhinorrhea present. No epistaxis. Right sinus exhibits no maxillary sinus tenderness and no frontal sinus tenderness. Left sinus exhibits no maxillary sinus tenderness and no frontal sinus tenderness.  Mouth/Throat: Uvula is midline and mucous membranes are normal. Mucous membranes are not pale and not cyanotic. No oropharyngeal exudate, posterior oropharyngeal edema, posterior oropharyngeal erythema or tonsillar abscesses.  Eyes: Conjunctivae are normal. Pupils are equal, round, and reactive to light.  Neck: Normal range of motion and full passive range of motion without pain.    Cardiovascular: Normal rate and intact distal pulses.   Pulmonary/Chest: Effort normal. No stridor. No respiratory distress. She has decreased breath sounds ( Throughout). She has no rhonchi. She has no rales.  Wheezing throughout.  No focal breath sounds. No rhonchi or rales  Abdominal: Soft. There is no tenderness.  Musculoskeletal: Normal range of motion.  Lymphadenopathy:    She has no cervical adenopathy.  Neurological: She is alert.  Skin: Skin is warm and dry. No rash noted. She is not diaphoretic.  Psychiatric: She has a normal mood and affect.  Nursing note and vitals reviewed.    ED Treatments / Results   Procedures Procedures (including critical care time)  Medications Ordered in ED Medications  dexamethasone (DECADRON) injection 10 mg (not administered)  albuterol (PROVENTIL) (2.5 MG/3ML) 0.083% nebulizer solution 5 mg (not administered)  albuterol (PROVENTIL HFA;VENTOLIN HFA) 108 (90 Base) MCG/ACT inhaler 2 puff (not administered)  AEROCHAMBER PLUS FLO-VU MEDIUM MISC 1 each (not administered)  fluticasone (FLONASE) 50 MCG/ACT nasal spray 2 spray (not administered)     Initial Impression / Assessment and Plan / ED Course  I have reviewed the triage vital signs and the nursing notes.  Pertinent labs & imaging results that were available during my care of the patient were reviewed by me and considered in my medical decision making (see chart for details).  Clinical Course    Patient presents with URI symptoms. She is well appearing and afebrile. No focal lung sounds. Doubt pneumonia at this time. She continues to smoke. Will give albuterol, Flonase and Decadron. She has no history of diabetes.  Patient with clear and equal breath sounds after albuterol treatment. Will be discharged home. No hypoxia in the emergency department.  Smoking cessation discussed.  Final Clinical Impressions(s) / ED Diagnoses   Final diagnoses:  Bronchitis  Wheezing    New  Prescriptions New Prescriptions   No medications on file     Dierdre ForthHannah Cherry Wittwer, PA-C 09/18/16 [id number]    April Palumbo, MD 09/18/16 0530

## 2016-09-18 NOTE — ED Notes (Signed)
Pt was dc'd during downtime, see downtime form for dc information

## 2016-09-18 NOTE — Discharge Instructions (Signed)
1. Medications: flonase, mucinex, albuterol, usual home medications 2. Treatment: rest, drink plenty of fluids, take tylenol or ibuprofen for fever control 3. Follow Up: Please followup with your primary doctor in 3 days for discussion of your diagnoses and further evaluation after today's visit; if you do not have a primary care doctor use the resource guide provided to find one; Return to the ER for high fevers, difficulty breathing or other concerning symptoms

## 2016-09-18 NOTE — ED Triage Notes (Signed)
Pt arrives to the ER via EMS for complaints of cough, congestion; pt states "I needed to come before it got bad"; pt states "Just write me a prescription so I can get some medications from [facility]"; pt afebrile; pt is homeless

## 2016-09-21 ENCOUNTER — Encounter (HOSPITAL_COMMUNITY): Payer: Self-pay | Admitting: Emergency Medicine

## 2016-09-21 ENCOUNTER — Emergency Department (HOSPITAL_COMMUNITY)
Admission: EM | Admit: 2016-09-21 | Discharge: 2016-09-21 | Disposition: A | Discharge status: Home / Self Care | Payer: Medicare Other | Attending: Emergency Medicine | Admitting: Emergency Medicine

## 2016-09-21 ENCOUNTER — Emergency Department (HOSPITAL_COMMUNITY): Payer: Medicare Other

## 2016-09-21 DIAGNOSIS — J40 Bronchitis, not specified as acute or chronic: Secondary | ICD-10-CM | POA: Diagnosis not present

## 2016-09-21 DIAGNOSIS — F17210 Nicotine dependence, cigarettes, uncomplicated: Secondary | ICD-10-CM | POA: Insufficient documentation

## 2016-09-21 DIAGNOSIS — I10 Essential (primary) hypertension: Secondary | ICD-10-CM | POA: Diagnosis not present

## 2016-09-21 DIAGNOSIS — R05 Cough: Secondary | ICD-10-CM | POA: Diagnosis present

## 2016-09-21 MED ORDER — AZITHROMYCIN 250 MG PO TABS: 250.0000 mg | ORAL_TABLET | Freq: Every day | ORAL | 0 refills | Status: DC

## 2016-09-21 MED ORDER — IPRATROPIUM-ALBUTEROL 0.5-2.5 (3) MG/3ML IN SOLN
3.0000 mL | Freq: Once | RESPIRATORY_TRACT | Status: AC
  Administered 2016-09-21: 3 mL via RESPIRATORY_TRACT
  Filled 2016-09-21: qty 3

## 2016-09-21 MED ORDER — PREDNISONE 10 MG (21) PO TBPK: 10.0000 mg | ORAL_TABLET | Freq: Every day | ORAL | 0 refills | Status: DC

## 2016-09-21 NOTE — ED Provider Notes (Signed)
[facility]-EMERGENCY DEPT Provider Note   CSN: [id number] Arrival date & time: 09/21/16  1445  By signing my name below, I, Soijett Blue, attest that this documentation has been prepared under the direction and in the presence of Jerico Grisso, PA-C Electronically Signed: Soijett Blue, ED Scribe. 09/21/16. 3:44 PM.  History   Chief Complaint Chief Complaint  Patient presents with  . Nasal Congestion  . Cough    HPI PRISILA Nguyen is a 55 y.o. female with a PMHx of homelessness, paranoid schizophrenia, who presents to the Emergency Department brought in by EMS complaining of nasal congestion onset 5 days ago. Pt notes that she was seen 3 days ago for the same URI-like symptoms. Pt denies picking up any prescription antibiotic for her symptoms. She states that she is having associated symptoms of rhinorrhea, watery eyes, subjective fever, and appetite change. She states that she has tried flonase and albuterol inhaler with mild relief for her symptoms. Patient returns to the ED because her symptoms have not resolved. She denies shortness of breath, chest pain, or any other symptoms.  Per pt chart review: Pt was seen in the ED on 09/18/2016 for URI-like symptoms. Pt was given a breathing treatment, decadron, flonase, and albuterol inhaler. Pt was Rx zithromax for their symptoms.   The history is provided by the patient. No language interpreter was used.    Past Medical History:  Diagnosis Date  . Paranoid schizophrenia [facility])     Patient Active Problem List   Diagnosis Date Noted  . CAP (community acquired pneumonia)   . Influenza A   . Essential hypertension   . Right hip pain   . Paranoid schizophrenia, chronic condition (HCC) 01/28/2016  . Non-compliance with treatment 01/28/2016  . Acute respiratory failure with hypoxia (HCC) 01/27/2016  . Lobar pneumonia, unspecified organism (HCC) 01/27/2016  . History of schizophrenia 01/27/2016    History reviewed. No pertinent surgical  history.  OB History    No data available       Home Medications    Prior to Admission medications   Medication Sig Start Date End Date Taking? Authorizing Provider  amLODipine (NORVASC) 10 MG tablet Take 1 tablet (10 mg total) by mouth daily. 02/04/16   Rodolph Bong, MD  azithromycin (ZITHROMAX) 250 MG tablet Take 1 tablet (250 mg total) by mouth daily. Take first 2 tablets together, then 1 every day until finished. 09/21/16   Deby Adger C Makinzee Durley, PA-C  ibuprofen (ADVIL) 200 MG tablet 200 mg. Take 2 tablets (400 mg) by mouth every 6 hours as needed for mild pain. Take 3 tablets (600 mg) by mouth every 6 hours as needed for severe pain.    Historical Provider, MD  isosorbide mononitrate (IMDUR) 30 MG 24 hr tablet Take 1 tablet (30 mg total) by mouth daily. 02/04/16   Rodolph Bong, MD  oxyCODONE (OXY IR/ROXICODONE) 5 MG immediate release tablet Take 1 tablet (5 mg total) by mouth every 6 (six) hours as needed for breakthrough pain. 02/04/16   Rodolph Bong, MD  predniSONE (STERAPRED UNI-PAK 21 TAB) 10 MG (21) TBPK tablet Take 1 tablet (10 mg total) by mouth daily. Take 6 tabs day 1, 5 tabs day 2, 4 tabs day 3, 3 tabs day 4, 2 tabs day 5, and 1 tab on day 6. 09/21/16   Presli Fanguy C Hanae Waiters, PA-C  risperiDONE (RISPERDAL) 1 MG tablet Take 1 mg by mouth at bedtime.    Historical Provider, MD    [facility]  History Family History  Problem Relation Age of Onset  . Family history unknown: Yes    Social History Social History  Substance Use Topics  . Smoking status: Current Every Day Smoker    Packs/day: 0.50    Types: Cigarettes  . Smokeless tobacco: Never Used  . Alcohol use Yes     Comment: occasional     Allergies   Codeine   Review of Systems Review of Systems  Constitutional: Positive for appetite change and fever (subjective).  HENT: Positive for congestion and rhinorrhea.   Eyes:       +Watery eyes  Respiratory: Positive for cough. Negative for shortness of breath.    Cardiovascular: Negative for chest pain.  Gastrointestinal: Positive for nausea. Negative for abdominal pain.  Skin: Negative for rash.  Neurological: Negative for headaches.  All other systems reviewed and are negative.    Physical Exam Updated Vital Signs BP 159/84 (BP Location: Left Arm)   Pulse 104   Temp 99.7 F (37.6 C) (Oral)   Resp 16   SpO2 100%   Physical Exam  Constitutional: She appears well-developed and well-nourished. No distress.  HENT:  Head: Normocephalic and atraumatic.  Eyes: Conjunctivae are normal.  Neck: Normal range of motion. Neck supple.  Cardiovascular: Normal rate, regular rhythm, normal heart sounds and intact distal pulses.   Pulmonary/Chest: Effort normal. No respiratory distress. She has wheezes.  Wheezing in all fields, worse on left.   Abdominal: Soft. There is no tenderness. There is no guarding.  Musculoskeletal: She exhibits no edema.  Lymphadenopathy:    She has no cervical adenopathy.  Neurological: She is alert.  Skin: Skin is warm and dry. She is not diaphoretic.  Psychiatric: She has a normal mood and affect. Her behavior is normal.  Nursing note and vitals reviewed.    ED Treatments / Results  DIAGNOSTIC STUDIES: Oxygen Saturation is 100% on RA, nl by my interpretation.    COORDINATION OF CARE: 3:40 PM Discussed treatment plan with pt at bedside which includes breathing treatment, CXR, and pt agreed to plan.   Radiology Dg Chest 2 View  Result Date: 09/21/2016 CLINICAL DATA:  Cough, congestion, body aches EXAM: CHEST  2 VIEW COMPARISON:  01/27/2016 FINDINGS: Lungs are clear.  No pleural effusion or pneumothorax. The heart is top-normal in size. Visualized osseous structures are within normal limits. IMPRESSION: No evidence of acute cardiopulmonary disease. Electronically Signed   By: Charline BillsSriyesh  Krishnan M.D.   On: 09/21/2016 16:02    Procedures Procedures (including critical care time)  Medications Ordered in  ED Medications  ipratropium-albuterol (DUONEB) 0.5-2.5 (3) MG/3ML nebulizer solution 3 mL (3 mLs Nebulization Given 09/21/16 1618)     Initial Impression / Assessment and Plan / ED Course  I have reviewed the triage vital signs and the nursing notes.  Pertinent imaging results that were available during my care of the patient were reviewed by me and considered in my medical decision making (see chart for details).  Clinical Course     Patient presents with cough, subjective fever, and rhinorrhea for the past 5 days. Patient is nontoxic appearing, not tachypneic, not hypotensive, maintains adequate SPO2 on room air, and is in no apparent distress. Patient has no signs of sepsis or other serious or life-threatening condition. No opacity on chest x-ray. Likely diagnosis is acute bronchitis versus clinical pneumonia. Patient was represcribed the azithromycin and encouraged to fill this prescription. The patient was given instructions for further home care as well as return  precautions. Patient voices understanding of these instructions, accepts the plan, and is comfortable with discharge.    Vitals:   09/21/16 1449 09/21/16 1618 09/21/16 1637  BP: 159/84  163/89  Pulse: 104  112  Resp: 16  17  Temp: 99.7 F (37.6 C)    TempSrc: Oral    SpO2: 100% 96% 95%     Final Clinical Impressions(s) / ED Diagnoses   Final diagnoses:  Bronchitis    New Prescriptions Discharge Medication List as of 09/21/2016  4:23 PM    START taking these medications   Details  predniSONE (STERAPRED UNI-PAK 21 TAB) 10 MG (21) TBPK tablet Take 1 tablet (10 mg total) by mouth daily. Take 6 tabs day 1, 5 tabs day 2, 4 tabs day 3, 3 tabs day 4, 2 tabs day 5, and 1 tab on day 6., Starting Wed 09/21/2016, Print       I personally performed the services described in this documentation, which was scribed in my presence. The recorded information has been reviewed and is accurate.    Anselm PancoastShawn C Iyanah Demont, PA-C 09/21/16  1803    Gwyneth SproutWhitney Plunkett, MD 09/22/16 1459

## 2016-09-21 NOTE — ED Notes (Signed)
Respiratory called for breathing treatment.

## 2016-09-21 NOTE — Discharge Instructions (Signed)
You have symptoms of bronchitis. These symptoms can last for 7-10 days. Drink plenty fluids and get plenty of rest. Be sure to get the azithromycin, and antibiotic, filled and take them until they are gone. Continue to use albuterol inhaler as needed. Continue to use the Mucinex and Flonase until symptoms resolve. Follow-up with a primary care provider for continued management of this issue.

## 2016-09-21 NOTE — ED Triage Notes (Signed)
Pt is a poor historian. Continues to talk about people she may live with. Denies any medical or surgical history. Cough is moist, nonproductive. No wheezing noted

## 2016-09-21 NOTE — ED Triage Notes (Addendum)
Per EMS, [facility], c/o nasal congestion,  Cough, and generalized body aches x 5 days. Pain score 2/10.  [facility].  Pt reported "it's getting worse."    EMS reports "she's homeless and I think, she's having drama with people that she lives with."

## 2016-09-24 ENCOUNTER — Emergency Department (HOSPITAL_COMMUNITY): Payer: Medicare Other

## 2016-09-24 ENCOUNTER — Emergency Department (HOSPITAL_COMMUNITY)
Admission: EM | Admit: 2016-09-24 | Discharge: 2016-09-24 | Disposition: A | Discharge status: Home / Self Care | Payer: Medicare Other | Attending: Emergency Medicine | Admitting: Emergency Medicine

## 2016-09-24 ENCOUNTER — Encounter (HOSPITAL_COMMUNITY): Payer: Self-pay | Admitting: Emergency Medicine

## 2016-09-24 DIAGNOSIS — I10 Essential (primary) hypertension: Secondary | ICD-10-CM | POA: Insufficient documentation

## 2016-09-24 DIAGNOSIS — J209 Acute bronchitis, unspecified: Secondary | ICD-10-CM | POA: Insufficient documentation

## 2016-09-24 DIAGNOSIS — Z79899 Other long term (current) drug therapy: Secondary | ICD-10-CM | POA: Diagnosis not present

## 2016-09-24 DIAGNOSIS — F17210 Nicotine dependence, cigarettes, uncomplicated: Secondary | ICD-10-CM | POA: Diagnosis not present

## 2016-09-24 DIAGNOSIS — R05 Cough: Secondary | ICD-10-CM | POA: Diagnosis present

## 2016-09-24 LAB — CBC WITH DIFFERENTIAL/PLATELET
Basophils Absolute: 0 10*3/uL (ref 0.0–0.1)
Basophils Relative: 0 %
EOS ABS: 0 10*3/uL (ref 0.0–0.7)
Eosinophils Relative: 0 %
HCT: 38.7 % (ref 36.0–46.0)
HEMOGLOBIN: 12.5 g/dL (ref 12.0–15.0)
LYMPHS ABS: 2.1 10*3/uL (ref 0.7–4.0)
LYMPHS PCT: 25 %
MCH: 29.6 pg (ref 26.0–34.0)
MCHC: 32.3 g/dL (ref 30.0–36.0)
MCV: 91.7 fL (ref 78.0–100.0)
Monocytes Absolute: 0.9 10*3/uL (ref 0.1–1.0)
Monocytes Relative: 10 %
NEUTROS PCT: 65 %
Neutro Abs: 5.6 10*3/uL (ref 1.7–7.7)
Platelets: 225 10*3/uL (ref 150–400)
RBC: 4.22 MIL/uL (ref 3.87–5.11)
RDW: 13.5 % (ref 11.5–15.5)
WBC: 8.6 10*3/uL (ref 4.0–10.5)

## 2016-09-24 LAB — BASIC METABOLIC PANEL
ANION GAP: 7 (ref 5–15)
BUN: 13 mg/dL (ref 6–20)
CHLORIDE: 106 mmol/L (ref 101–111)
CO2: 28 mmol/L (ref 22–32)
CREATININE: 0.68 mg/dL (ref 0.44–1.00)
Calcium: 8.7 mg/dL — ABNORMAL LOW (ref 8.9–10.3)
GFR calc non Af Amer: >60 mL/min (ref >60)
Glucose, Bld: 96 mg/dL (ref 65–99)
POTASSIUM: 3.2 mmol/L — AB (ref 3.5–5.1)
SODIUM: 141 mmol/L (ref 135–145)

## 2016-09-24 MED ORDER — IPRATROPIUM BROMIDE 0.02 % IN SOLN
0.5000 mg | Freq: Once | RESPIRATORY_TRACT | Status: AC
  Administered 2016-09-24: 0.5 mg via RESPIRATORY_TRACT
  Filled 2016-09-24: qty 2.5

## 2016-09-24 MED ORDER — ALBUTEROL (5 MG/ML) CONTINUOUS INHALATION SOLN
10.0000 mg/h | INHALATION_SOLUTION | Freq: Once | RESPIRATORY_TRACT | Status: AC
  Administered 2016-09-24: 10 mg/h via RESPIRATORY_TRACT
  Filled 2016-09-24: qty 20

## 2016-09-24 MED ORDER — POTASSIUM CHLORIDE CRYS ER 20 MEQ PO TBCR
40.0000 meq | EXTENDED_RELEASE_TABLET | Freq: Once | ORAL | Status: AC
  Administered 2016-09-24: 40 meq via ORAL
  Filled 2016-09-24: qty 2

## 2016-09-24 MED ORDER — METHYLPREDNISOLONE SODIUM SUCC 125 MG IJ SOLR
125.0000 mg | Freq: Once | INTRAMUSCULAR | Status: AC
  Administered 2016-09-24: 125 mg via INTRAVENOUS
  Filled 2016-09-24: qty 2

## 2016-09-24 MED ORDER — SODIUM CHLORIDE 0.9 % IV BOLUS (SEPSIS)
500.0000 mL | Freq: Once | INTRAVENOUS | Status: AC
  Administered 2016-09-24: 500 mL via INTRAVENOUS

## 2016-09-24 MED ORDER — ALBUTEROL SULFATE (2.5 MG/3ML) 0.083% IN NEBU
5.0000 mg | INHALATION_SOLUTION | Freq: Once | RESPIRATORY_TRACT | Status: AC
  Administered 2016-09-24: 5 mg via RESPIRATORY_TRACT
  Filled 2016-09-24: qty 6

## 2016-09-24 NOTE — ED Triage Notes (Signed)
Per EMS pt reports having cough along with right ear pain and left knee pain that has been ongoing. Pt seen on 09/21/16 for same complaint.

## 2016-09-24 NOTE — ED Notes (Signed)
O2 sats 99% during ambulation

## 2016-09-24 NOTE — ED Notes (Signed)
Patient refused to get into gown.

## 2016-09-24 NOTE — ED Triage Notes (Signed)
Pt refused to sign discharge instructions, she has been provided with shelters, [facility] information and discharge instruction.

## 2016-09-24 NOTE — ED Triage Notes (Signed)
Pt continues to sleep, no signs of distress.

## 2016-09-24 NOTE — ED Notes (Signed)
Pt ambulated to the bathroom her O2 was at 95%

## 2016-09-24 NOTE — Progress Notes (Signed)
CSW received consult for patient experiencing homelessness. CSW spoke with patient at bedside. Patient was receiving breathing treatment, speech hard to understand. CSW inquired about patient's current housing situation, patient reported that she is ready to make a change. Patient reported that she had been staying in the extended stay. CSW informed patient that she had contacted several local shelters, patient interrupted and reported that she does not want to go to a shelter. Patient reported that she is able to live independently and that she doesn't need help. Patient reports receiving SSI and reports the ability to pay rent. Patient stated "I was going to get on the plane, the only thing that kept me from going was my knees". CSW inquired where patient was going, patient reported that it doesn't matter and that she has an open airline ticket. Patient stated "why haven't yall just set me up with a house yet"? CSW informed patient that CSW is unable to set patient up with a house but is able to provide resources about transitional housing. CSW started to explain transitional housing, patient interrupted and reported that she is "getting upset" and requested that CSW come back later. CSW agreed and left housing resources with patient.

## 2016-09-24 NOTE — ED Notes (Signed)
Bed: WA17 Expected date:  Expected time:  Means of arrival:  Comments: 55yo F/ cough/body aches

## 2016-09-24 NOTE — ED Provider Notes (Signed)
[facility]-EMERGENCY DEPT Provider Note   CSN: [id number] Arrival date & time: 09/24/16  0315    History   Chief Complaint Chief Complaint  Patient presents with  . Cough  . Knee Pain    HPI [name] is a 55 y.o. female.  55 year old female with a history of paranoid schizophrenia presents to the emergency department for evaluation of cough. Patient evaluated twice in the previous week for bronchitis. She was started on azithromycin. She has been taking this antibiotic as prescribed. Patient also with an albuterol inhaler which she has been using as needed. She presents with an audible wheeze. No known recent fevers. Patient also complaining of left knee pain, though she denies any injury. She exhibits tangential speech and mild paranoia; slightly difficult to redirect. When asked how I was last able to help the patient today she replies, "you can give me a list of group homes that I can go to".      Past Medical History:  Diagnosis Date  . Paranoid schizophrenia [facility](HCC)     Patient Active Problem List   Diagnosis Date Noted  . CAP (community acquired pneumonia)   . Influenza A   . Essential hypertension   . Right hip pain   . Paranoid schizophrenia, chronic condition (HCC) 01/28/2016  . Non-compliance with treatment 01/28/2016  . Acute respiratory failure with hypoxia (HCC) 01/27/2016  . Lobar pneumonia, unspecified organism (HCC) 01/27/2016  . History of schizophrenia 01/27/2016    History reviewed. No pertinent surgical history.  OB History    No data available       Home Medications    Prior to Admission medications   Medication Sig Start Date End Date Taking? Authorizing Provider  amLODipine (NORVASC) 10 MG tablet Take 1 tablet (10 mg total) by mouth daily. 02/04/16   Rodolph Bonganiel V Thompson, MD  azithromycin (ZITHROMAX) 250 MG tablet Take 1 tablet (250 mg total) by mouth daily. Take first 2 tablets together, then 1 every day until finished. 09/21/16   Shawn C Joy,  PA-C  ibuprofen (ADVIL) 200 MG tablet 200 mg. Take 2 tablets (400 mg) by mouth every 6 hours as needed for mild pain. Take 3 tablets (600 mg) by mouth every 6 hours as needed for severe pain.    Historical Provider, MD  isosorbide mononitrate (IMDUR) 30 MG 24 hr tablet Take 1 tablet (30 mg total) by mouth daily. 02/04/16   Rodolph Bonganiel V Thompson, MD  oxyCODONE (OXY IR/ROXICODONE) 5 MG immediate release tablet Take 1 tablet (5 mg total) by mouth every 6 (six) hours as needed for breakthrough pain. 02/04/16   Rodolph Bonganiel V Thompson, MD  predniSONE (STERAPRED UNI-PAK 21 TAB) 10 MG (21) TBPK tablet Take 1 tablet (10 mg total) by mouth daily. Take 6 tabs day 1, 5 tabs day 2, 4 tabs day 3, 3 tabs day 4, 2 tabs day 5, and 1 tab on day 6. 09/21/16   Shawn C Joy, PA-C  risperiDONE (RISPERDAL) 1 MG tablet Take 1 mg by mouth at bedtime.    Historical Provider, MD    Family History Family History  Problem Relation Age of Onset  . Family history unknown: Yes    Social History Social History  Substance Use Topics  . Smoking status: Current Every Day Smoker    Packs/day: 0.50    Types: Cigarettes  . Smokeless tobacco: Never Used  . Alcohol use Yes     Comment: occasional     Allergies   Codeine  Review of Systems Review of Systems Ten systems reviewed and are negative for acute change, except as noted in the HPI.    Physical Exam Updated Vital Signs BP (!) 146/117 (BP Location: Right Wrist)   Pulse 72   Temp 97.9 F (36.6 C) (Oral)   Resp 18   SpO2 97%   Physical Exam  Constitutional: She is oriented to person, place, and time. She appears well-developed and well-nourished. No distress.  Disheveled in appearance. In NAD. Nontoxic.  HENT:  Head: Normocephalic and atraumatic.  Mouth/Throat: Oropharynx is clear and moist.  Audible nasal congestion  Eyes: Conjunctivae and EOM are normal. No scleral icterus.  Neck: Normal range of motion.  Cardiovascular: Normal rate, regular rhythm and intact  distal pulses.   Pulmonary/Chest: Effort normal. No respiratory distress. She has wheezes. She has no rales.  Respirations even and unlabored. Diffuse expiratory wheeze noted. No accessory muscles use. No rales.  Musculoskeletal: Normal range of motion.  Neurological: She is alert and oriented to person, place, and time. She exhibits normal muscle tone. Coordination normal.  GCS 15. Speech is goal oriented.  Skin: Skin is warm and dry. No rash noted. She is not diaphoretic. No erythema. No pallor.  Psychiatric: She has a normal mood and affect. Her behavior is normal. Her speech is tangential.  Nursing note and vitals reviewed.    ED Treatments / Results  Labs (all labs ordered are listed, but only abnormal results are displayed) Labs Reviewed - No data to display  EKG  EKG Interpretation None       Radiology No results found.  Procedures Procedures (including critical care time)  Medications Ordered in ED Medications  albuterol (PROVENTIL) (2.5 MG/3ML) 0.083% nebulizer solution 5 mg (5 mg Nebulization Given 09/24/16 0410)  ipratropium (ATROVENT) nebulizer solution 0.5 mg (0.5 mg Nebulization Given 09/24/16 0411)     Initial Impression / Assessment and Plan / ED Course  I have reviewed the triage vital signs and the nursing notes.  Pertinent labs & imaging results that were available during my care of the patient were reviewed by me and considered in my medical decision making (see chart for details).  Clinical Course     4:57 AM Patient reassessed. She only tolerated half of a Duoneb as she felt as though the medication "made me tighten". Expiratory wheeze persists on auscultation. Cough is intermittent and dry; nonproductive. Patient is homeless and it is clear that she is having trouble caring for herself, despite medication compliance. Will repeat CXR and assess pulse ox with ambulation.  5:48 AM Patient ambulatory in the ED without hypoxia. She is pending repeat  CXR to evaluate for any worsening symptoms.  6:04 AM Patient signed out to Harolyn RutherfordShawn Joy, PA-C at shift change who will assume care and disposition appropriately. Patient may benefit from SW consult in the morning given sickness and states of homelessness.   Final Clinical Impressions(s) / ED Diagnoses   Final diagnoses:  Acute bronchitis, unspecified organism    New Prescriptions New Prescriptions   No medications on file     Antony MaduraKelly Deleah Tison, PA-C 09/24/16 [id number]    Dione Boozeavid Glick, MD 09/24/16 [phone]

## 2016-09-24 NOTE — Discharge Instructions (Signed)
There were no significant abnormalities on your labs or imaging today. These symptoms can last for 7-10 days, sometimes more. Social work has given you resources for alternative housing. Follow-up with a primary care provider for continued management of any breathing problems. Return to the ED should symptoms worsen.

## 2016-09-24 NOTE — ED Provider Notes (Signed)
Jasmine SneddonSherry L Nguyen is a 55 y.o. female, with a history of paranoid schizophrenia, presenting to the ED with continued cough and shortness of breath.  HPI from Antony MaduraKelly Humes, PA-C: "55 year old female with a history of paranoid schizophrenia presents to the emergency department for evaluation of cough. Patient evaluated twice in the previous week for bronchitis. She was started on azithromycin. She has been taking this antibiotic as prescribed. Patient also with an albuterol inhaler which she has been using as needed. She presents with an audible wheeze. No known recent fevers. Patient also complaining of left knee pain, though she denies any injury. She exhibits tangential speech and mild paranoia; slightly difficult to redirect. When asked how I was last able to help the patient today she replies, "you can give me a list of group homes that I can go to"."  Past Medical History:  Diagnosis Date  . Paranoid schizophrenia (HCC)      Physical Exam  BP (!) 146/117 (BP Location: Right Wrist)   Pulse 72   Temp 97.9 F (36.6 C) (Oral)   Resp 18   SpO2 97%   Physical Exam  Constitutional: She appears well-developed and well-nourished. No distress.  HENT:  Head: Normocephalic and atraumatic.  Eyes: Conjunctivae are normal.  Neck: Neck supple.  Cardiovascular: Normal rate, regular rhythm, normal heart sounds and intact distal pulses.   Pulmonary/Chest: No respiratory distress.  Upon my evaluation, patient exhibits increased work of breathing. Wheezes and rhonchi are audible from the bedside.  Abdominal: There is no guarding.  Musculoskeletal: She exhibits no edema.  Lymphadenopathy:    She has no cervical adenopathy.  Neurological: She is alert.  Skin: Skin is warm and dry. She is not diaphoretic.  Psychiatric: She has a normal mood and affect. Her behavior is normal.  Nursing note and vitals reviewed.   ED Course  Procedures   Dg Chest 2 View  Result Date: 09/24/2016 CLINICAL DATA:   Confusion.  Cough and congestion. EXAM: CHEST  2 VIEW COMPARISON:  09/21/2016 FINDINGS: The heart size and mediastinal contours are within normal limits. Both lungs are clear. The visualized skeletal structures are unremarkable. IMPRESSION: No active cardiopulmonary disease. Electronically Signed   By: Burman NievesWilliam  Stevens M.D.   On: 09/24/2016 06:24   Dg Chest 2 View  Result Date: 09/21/2016 CLINICAL DATA:  Cough, congestion, body aches EXAM: CHEST  2 VIEW COMPARISON:  01/27/2016 FINDINGS: Lungs are clear.  No pleural effusion or pneumothorax. The heart is top-normal in size. Visualized osseous structures are within normal limits. IMPRESSION: No evidence of acute cardiopulmonary disease. Electronically Signed   By: Charline BillsSriyesh  Krishnan M.D.   On: 09/21/2016 16:02     MDM  Took patient care handoff report from [facility] Humes, PA-C. Plan: If CXR clear, reevaluate clinically. Observation admission vs discharge. SW and/or case management consult.  Upon my initial evaluation, patient was resting comfortably on the bed in no apparent distress. Med Tech later reports that the patient's SPO2 has dropped to about 87%, patient with no apparent distress. Patient was reassessed following continuous nebulizer and IV Solu-Medrol. Patient shows no increased work of breathing. SPO2 100% on room air. SPO2 99% during ambulation. Social worker also spoke to the patient and offered her resources on transitional housing. Social worker also offered to set the patient up at a shelter. Patient became upset and stated that she did not want to be a shelter and wanted the social worker to set her up in a house. It explained to  the patient that this is not possible.  Patient currently meets no admission criteria. Strict return precautions discussed. Patient voiced understanding of all instructions and is comfortable with discharge.   The course of this patient's complaint is as follows:  Nov 5: Patient presents to the ED with  complaints of symptoms consistent with URI for 2 days including nonproductive cough, wheezing, ear pain, and rhinorrhea. Patient was given Decadron and albuterol in the ED, sent home with albuterol inhaler and Flonase, and given a prescription for azithromycin.  Nov 8: Patient was seen by me with continued complaint of nonproductive cough, wheezing, and rhinorrhea. Patient was given DuoNeb in the ED. Patient did not previously fill her azithromycin prescription. This was represcribed for her in addition to a six-day course of prednisone. Not ill appearing and had a clear CXR at that time.  Nov 11: Patient presents again with similar complaint of cough and wheezing. Has been taking the antibiotic, per patient. Patient states she would like to be directed to a group home for a place to stay.   Vitals:   09/24/16 0316 09/24/16 0322  BP:  (!) 146/117  Pulse:  72  Resp:  18  Temp:  97.9 F (36.6 C)  TempSrc:  Oral  SpO2: 99% 97%    Vitals:   09/24/16 0800 09/24/16 0814 09/24/16 0936 09/24/16 1052  BP:  119/63  138/90  Pulse:  102  107  Resp:  20  22  Temp:  98.7 F (37.1 C)    TempSrc:  Oral    SpO2: (!) 87% 97% 100% 100%         Anselm PancoastShawn C Joy, PA-C 09/24/16 1151    Rolland PorterMark James, MD 10/12/16 1028

## 2016-09-24 NOTE — ED Notes (Signed)
O2 saturation dropped in the mid 80s 87-88% room air 2x. MD aware and at bedside.

## 2016-10-22 ENCOUNTER — Inpatient Hospital Stay (HOSPITAL_COMMUNITY)
Admission: EM | Admit: 2016-10-22 | Discharge: 2016-10-24 | DRG: 190 | Disposition: A | Discharge status: Other Acute Inpatient Hospital | Payer: Medicare Other | Attending: Internal Medicine | Admitting: Internal Medicine

## 2016-10-22 ENCOUNTER — Emergency Department (HOSPITAL_COMMUNITY): Payer: Medicare Other

## 2016-10-22 ENCOUNTER — Encounter (HOSPITAL_COMMUNITY): Payer: Self-pay | Admitting: Oncology

## 2016-10-22 DIAGNOSIS — Z9119 Patient's noncompliance with other medical treatment and regimen: Secondary | ICD-10-CM

## 2016-10-22 DIAGNOSIS — J441 Chronic obstructive pulmonary disease with (acute) exacerbation: Principal | ICD-10-CM | POA: Diagnosis present

## 2016-10-22 DIAGNOSIS — J440 Chronic obstructive pulmonary disease with acute lower respiratory infection: Secondary | ICD-10-CM | POA: Diagnosis present

## 2016-10-22 DIAGNOSIS — Z79899 Other long term (current) drug therapy: Secondary | ICD-10-CM

## 2016-10-22 DIAGNOSIS — F17210 Nicotine dependence, cigarettes, uncomplicated: Secondary | ICD-10-CM | POA: Diagnosis present

## 2016-10-22 DIAGNOSIS — Z9114 Patient's other noncompliance with medication regimen: Secondary | ICD-10-CM

## 2016-10-22 DIAGNOSIS — J209 Acute bronchitis, unspecified: Secondary | ICD-10-CM | POA: Diagnosis present

## 2016-10-22 DIAGNOSIS — F200 Paranoid schizophrenia: Secondary | ICD-10-CM | POA: Diagnosis present

## 2016-10-22 DIAGNOSIS — R05 Cough: Secondary | ICD-10-CM | POA: Diagnosis not present

## 2016-10-22 DIAGNOSIS — G9340 Encephalopathy, unspecified: Secondary | ICD-10-CM | POA: Diagnosis not present

## 2016-10-22 DIAGNOSIS — Z590 Homelessness: Secondary | ICD-10-CM

## 2016-10-22 DIAGNOSIS — J40 Bronchitis, not specified as acute or chronic: Secondary | ICD-10-CM

## 2016-10-22 DIAGNOSIS — Z91199 Patient's noncompliance with other medical treatment and regimen due to unspecified reason: Secondary | ICD-10-CM

## 2016-10-22 DIAGNOSIS — I10 Essential (primary) hypertension: Secondary | ICD-10-CM | POA: Diagnosis present

## 2016-10-22 LAB — COMPREHENSIVE METABOLIC PANEL
ALBUMIN: 3.7 g/dL (ref 3.5–5.0)
ALK PHOS: 75 U/L (ref 38–126)
ALT: 16 U/L (ref 14–54)
ANION GAP: 7 (ref 5–15)
AST: 20 U/L (ref 15–41)
BUN: 17 mg/dL (ref 6–20)
CALCIUM: 8.9 mg/dL (ref 8.9–10.3)
CHLORIDE: 107 mmol/L (ref 101–111)
CO2: 28 mmol/L (ref 22–32)
Creatinine, Ser: 0.53 mg/dL (ref 0.44–1.00)
GFR calc Af Amer: >60 mL/min (ref >60)
GFR calc non Af Amer: >60 mL/min (ref >60)
GLUCOSE: 119 mg/dL — AB (ref 65–99)
Potassium: 3.9 mmol/L (ref 3.5–5.1)
SODIUM: 142 mmol/L (ref 135–145)
Total Bilirubin: 0.5 mg/dL (ref 0.3–1.2)
Total Protein: 7.2 g/dL (ref 6.5–8.1)

## 2016-10-22 LAB — CBC
HEMATOCRIT: 41.9 % (ref 36.0–46.0)
HEMOGLOBIN: 13.1 g/dL (ref 12.0–15.0)
MCH: 29.2 pg (ref 26.0–34.0)
MCHC: 31.3 g/dL (ref 30.0–36.0)
MCV: 93.3 fL (ref 78.0–100.0)
Platelets: 196 10*3/uL (ref 150–400)
RBC: 4.49 MIL/uL (ref 3.87–5.11)
RDW: 13.5 % (ref 11.5–15.5)
WBC: 8.3 10*3/uL (ref 4.0–10.5)

## 2016-10-22 LAB — SALICYLATE LEVEL: Salicylate Lvl: <7 mg/dL (ref 2.8–30.0)

## 2016-10-22 LAB — ETHANOL: Alcohol, Ethyl (B): <5 mg/dL (ref <5)

## 2016-10-22 LAB — ACETAMINOPHEN LEVEL: ACETAMINOPHEN (TYLENOL), SERUM: 13 ug/mL (ref 10–30)

## 2016-10-22 MED ORDER — ALBUTEROL SULFATE (2.5 MG/3ML) 0.083% IN NEBU
5.0000 mg | INHALATION_SOLUTION | Freq: Once | RESPIRATORY_TRACT | Status: AC
  Administered 2016-10-22: 5 mg via RESPIRATORY_TRACT
  Filled 2016-10-22: qty 6

## 2016-10-22 MED ORDER — IPRATROPIUM BROMIDE 0.02 % IN SOLN
0.5000 mg | Freq: Once | RESPIRATORY_TRACT | Status: AC
  Administered 2016-10-22: 0.5 mg via RESPIRATORY_TRACT
  Filled 2016-10-22: qty 2.5

## 2016-10-22 MED ORDER — METHYLPREDNISOLONE SODIUM SUCC 125 MG IJ SOLR
125.0000 mg | Freq: Once | INTRAMUSCULAR | Status: AC
  Administered 2016-10-22: 125 mg via INTRAVENOUS
  Filled 2016-10-22: qty 2

## 2016-10-22 MED ORDER — AMLODIPINE BESYLATE 5 MG PO TABS: 10.0000 mg | ORAL_TABLET | Freq: Every day | ORAL | Status: DC

## 2016-10-22 MED ORDER — RISPERIDONE 1 MG PO TABS: 1.0000 mg | ORAL_TABLET | Freq: Every day | ORAL | Status: DC

## 2016-10-22 MED ORDER — IBUPROFEN 200 MG PO TABS: 200.0000 mg | ORAL_TABLET | Freq: Four times a day (QID) | ORAL | Status: DC | PRN

## 2016-10-22 NOTE — ED Notes (Signed)
Patient says she cannot urinate

## 2016-10-22 NOTE — ED Notes (Signed)
Report called to Sharyl NimrodMeredith, RN 3W.

## 2016-10-22 NOTE — ED Triage Notes (Signed)
Pt is not making sense at this time.  She states she has foot pain however is now upset because she states its her hips hurting.  Pt is hard to redirect.  Speaking about people taking her belongings.  Keeps asking to see a "real doctor."

## 2016-10-22 NOTE — H&P (Signed)
History and Physical    Jasmine SneddonSherry L Rorrer ZOX:[id number] DOB: [birth year] DOA: 10/22/2016  Referring MD/NP/PA: Dr. Radford PaxBeaton  PCP: Kirt Boysarter, Monica, DO  Patient coming from: unknown brought by [facility]  Chief Complaint: Cough  HPI: Jasmine SneddonSherry L Nguyen is a 55 y.o. female with medical history significant of paranoid schizophrenia, alcohol use, and tobacco abuse; who presents for complaints of cough. History is mainly obtained via reported as the patient is lethargic. She complaints of a productive cough and wheezing for at least a day. Patient was noted to be talking to people that were not there. While in the room she asked "where is Myriam JacobsonHelen", but did not answer follow-up questioning. Per EMS the patient was noted not to be on her psychiatric medications. Review of record shows that the patient has been seen in the [facility] ED three times within the last month with complaints of congestion and cough and treated for bronchitis with steroids and an azithromycin.  ED Course: Upon admission to the emergency department patient was found to be afebrile, pulse 101-115, respiratory 20-27, blood pressures maintained, and O2 saturation 90-100%. Lab work was relatively within normal limits including negative alcohol, acetaminophen, and salicylate levels. Chest x-ray showed no acute abnormalities. Patient was noted to have diffuse wheezing bilaterally for which she was given 125 mg of Solu-Medrol IV, albuterol 10 mg nebulized, and ipratropium 0.5 mg nebulized. TR[facility] called to admit. No UDS was initially collected on admission asked ED physician to place order along with temporary admission orders to a telemetry bed.    Review of systems: Unable to fully obtain secondary to patient's altered state. Past Medical History:  Diagnosis Date  . Paranoid schizophrenia (HCC)     History reviewed. No pertinent surgical history.   reports that she has been smoking Cigarettes.  She has been smoking about 0.50 packs per day. She has  never used smokeless tobacco. She reports that she drinks alcohol. She reports that she does not use drugs.  Allergies  Allergen Reactions  . Codeine Other (See Comments)    Makes her sleepy    Family History  Problem Relation Age of Onset  . Family history unknown: Yes    Prior to Admission medications   Medication Sig Start Date End Date Taking? Authorizing Provider  amLODipine (NORVASC) 10 MG tablet Take 1 tablet (10 mg total) by mouth daily. 02/04/16   Rodolph Bonganiel V Thompson, MD  azithromycin (ZITHROMAX) 250 MG tablet Take 1 tablet (250 mg total) by mouth daily. Take first 2 tablets together, then 1 every day until finished. 09/21/16   Shawn C Joy, PA-C  ibuprofen (ADVIL) 200 MG tablet 200 mg. Take 2 tablets (400 mg) by mouth every 6 hours as needed for mild pain. Take 3 tablets (600 mg) by mouth every 6 hours as needed for severe pain.    Historical Provider, MD  isosorbide mononitrate (IMDUR) 30 MG 24 hr tablet Take 1 tablet (30 mg total) by mouth daily. 02/04/16   Rodolph Bonganiel V Thompson, MD  oxyCODONE (OXY IR/ROXICODONE) 5 MG immediate release tablet Take 1 tablet (5 mg total) by mouth every 6 (six) hours as needed for breakthrough pain. 02/04/16   Rodolph Bonganiel V Thompson, MD  predniSONE (STERAPRED UNI-PAK 21 TAB) 10 MG (21) TBPK tablet Take 1 tablet (10 mg total) by mouth daily. Take 6 tabs day 1, 5 tabs day 2, 4 tabs day 3, 3 tabs day 4, 2 tabs day 5, and 1 tab on day 6. 09/21/16   Shawn C  Joy, PA-C  risperiDONE (RISPERDAL) 1 MG tablet Take 1 mg by mouth at bedtime.    Historical Provider, MD    Physical Exam:    Constitutional: Older disheveled female who appears lethargic and mostly mumbles when responding to questioning. Vitals:   10/22/16 2200 10/22/16 2212 10/22/16 2230 10/22/16 2252  BP: 170/78  126/83 126/83  Pulse: 115  109 106  Resp:    (!) 27  Temp:      TempSrc:      SpO2: 90% 94% 96% 96%  Weight:      Height:       Eyes: PERRL, lids and conjunctivae normal ENMT: Mucous  membranes are dry. Posterior pharynx clear of any exudate or lesions.  Neck: normal, supple, no masses, no thyromegaly Respiratory:  Tachypneic with expiratory wheezes appreciated throughout both lung fields.  Cardiovascular: Tachycardic, no murmurs / rubs / gallops. No extremity edema. 2+ pedal pulses. No carotid bruits.  Abdomen: no tenderness, no masses palpated. No hepatosplenomegaly. Bowel sounds positive.  Musculoskeletal: no clubbing / cyanosis. No joint deformity upper and lower extremities. Good ROM, no contractures. Normal muscle tone.  Skin: no rashes, lesions, ulcers. No induration Neurologic: Able to move all extremities no gross focal deficits noted Psychiatric:  Lethargic, but arousable for short period in time with verbal stimuli.    Labs on Admission: I have personally reviewed following labs and imaging studies  CBC:  Recent Labs Lab 10/22/16 2024  WBC 8.3  HGB 13.1  HCT 41.9  MCV 93.3  PLT 196   Basic Metabolic Panel:  Recent Labs Lab 10/22/16 2024  NA 142  K 3.9  CL 107  CO2 28  GLUCOSE 119*  BUN 17  CREATININE 0.53  CALCIUM 8.9   GFR: Estimated Creatinine Clearance: 87.7 mL/min (by C-G formula based on SCr of 0.53 mg/dL). Liver Function Tests:  Recent Labs Lab 10/22/16 2024  AST 20  ALT 16  ALKPHOS 75  BILITOT 0.5  PROT 7.2  ALBUMIN 3.7   No results for input(s): LIPASE, AMYLASE in the last 168 hours. No results for input(s): AMMONIA in the last 168 hours. Coagulation Profile: No results for input(s): INR, PROTIME in the last 168 hours. Cardiac Enzymes: No results for input(s): CKTOTAL, CKMB, CKMBINDEX, TROPONINI in the last 168 hours. BNP (last 3 results) No results for input(s): PROBNP in the last 8760 hours. HbA1C: No results for input(s): HGBA1C in the last 72 hours. CBG: No results for input(s): GLUCAP in the last 168 hours. Lipid Profile: No results for input(s): CHOL, HDL, LDLCALC, TRIG, CHOLHDL, LDLDIRECT in the last 72  hours. Thyroid Function Tests: No results for input(s): TSH, T4TOTAL, FREET4, T3FREE, THYROIDAB in the last 72 hours. Anemia Panel: No results for input(s): VITAMINB12, FOLATE, FERRITIN, TIBC, IRON, RETICCTPCT in the last 72 hours. Urine analysis:    Component Value Date/Time   COLORURINE YELLOW 01/28/2016 1715   APPEARANCEUR CLEAR 01/28/2016 1715   LABSPEC 1.019 01/28/2016 1715   PHURINE 6.0 01/28/2016 1715   GLUCOSEU NEGATIVE 01/28/2016 1715   HGBUR MODERATE (A) 01/28/2016 1715   BILIRUBINUR NEGATIVE 01/28/2016 1715   KETONESUR NEGATIVE 01/28/2016 1715   PROTEINUR NEGATIVE 01/28/2016 1715   UROBILINOGEN 0.2 02/09/2012 1359   NITRITE NEGATIVE 01/28/2016 1715   LEUKOCYTESUR NEGATIVE 01/28/2016 1715   Sepsis Labs: No results found for this or any previous visit (from the past 240 hour(s)).   Radiological Exams on Admission: Dg Chest 2 View  Result Date: 10/22/2016 CLINICAL DATA:  Productive cough.  EXAM: CHEST  2 VIEW COMPARISON:  09/24/2016 FINDINGS: Mild patient rotation. The cardiomediastinal contours are normal. The lungs are clear. Pulmonary vasculature is normal. No consolidation, pleural effusion, or pneumothorax. No acute osseous abnormalities are seen. IMPRESSION: No active cardiopulmonary disease. Electronically Signed   By: Rubye OaksMelanie  Ehinger M.D.   On: 10/22/2016 20:32    EKG: Independently reviewed. Sinus tachycardia with PVC   Assessment/Plan Suspected COPD exacerbation versus bronchitis: Acute. Patient presents with complaints of productive cough and wheezing. Chest x-ray shows no active disease. O2 saturations mildly decreased on room air. Chest x-ray negative for any acute abnormalities.Previously given azithromycin and steroids earlier last month. -  Admit to a telemetry bed - Continuous pulse oximetry with nasal cannula oxygen and keep O2 sats greater than 90% - DuoNeb's QID and prn SOB wheezing  - Solu-Medrol IV - Levaquin IV - Check urine Legionella and  respiratory viral panel  Acute encephalopathy: Patient appears more lethargic initial alcohol, salicylate, and acetaminophen levels negative. Maintaining O2 sats currently at this time on patient's symptoms. - neuro checks - follow-up UA, ammonia, and UDS - Will check ABG if patient does not improve   Paranoid schizophrenia with hallucinations: During last hospitalization in 01/2016 the patient was not discharged on any medications. Risperidone noted is a historical med. Patient noted to still be having hallucinations on admission   - May warrant formal psychiatry consultation inpatient   Tobacco abuse - Counsel on need of cession of tobacco prior to discharge as unable to as patient has currently while lethargic.  Alcohol use history: Unable to quantify how much patient normally drinks and initial alcohol level was undetectable. - CWIA protocol without schedule Ativan due to lethargy  Essential hypertension: Blood pressures appear to be more stable at this time. Patient was previously on amlodipine and isosorbide mononitrate Per review of records. Patient has a long history of noncompliance with medications. - Continue to monitor   Homeless and noncompliance with medications - Social work consult  GERD prophylaxis: protonix DVT prophylaxis: Lovenox   Code Status: Full Family Communication: No family present at bedside Disposition Plan: TBD Consults called: None Admission status: Observation  Clydie Braunondell A Bellarose Burtt MD [facility] Pager [phone]  If 7PM-7AM, please contact night-coverage [email or web address] Password [email or web address]  10/22/2016, 10:58 PM

## 2016-10-22 NOTE — ED Provider Notes (Signed)
[facility]-EMERGENCY DEPT Provider Note   CSN: [id number] Arrival date & time: 10/22/16  1944     History   Chief Complaint Chief Complaint  Patient presents with  . Medical Clearance  . Cough    HPI [name] is a 55 y.o. female.  HPI Pt bib [facility] d/t b/l foot pain as well as productive cough that started last night.  Pt is talking to people that are not there.  Per EMS pt reported that she was, "Off her meds."  Pt would not tell EMS what medications that she has been off of are Past Medical History:  Diagnosis Date  . Paranoid schizophrenia [facility](HCC)     Patient Active Problem List   Diagnosis Date Noted  . COPD exacerbation (HCC) 10/22/2016  . CAP (community acquired pneumonia)   . Influenza A   . Essential hypertension   . Right hip pain   . Paranoid schizophrenia, chronic condition (HCC) 01/28/2016  . Non-compliance with treatment 01/28/2016  . Acute respiratory failure with hypoxia (HCC) 01/27/2016  . Lobar pneumonia, unspecified organism (HCC) 01/27/2016  . History of schizophrenia 01/27/2016    History reviewed. No pertinent surgical history.  OB History    No data available       Home Medications    Prior to Admission medications   Medication Sig Start Date End Date Taking? Authorizing Provider  amLODipine (NORVASC) 10 MG tablet Take 1 tablet (10 mg total) by mouth daily. 02/04/16   Rodolph Bonganiel V Thompson, MD  azithromycin (ZITHROMAX) 250 MG tablet Take 1 tablet (250 mg total) by mouth daily. Take first 2 tablets together, then 1 every day until finished. 09/21/16   Shawn C Joy, PA-C  ibuprofen (ADVIL) 200 MG tablet 200 mg. Take 2 tablets (400 mg) by mouth every 6 hours as needed for mild pain. Take 3 tablets (600 mg) by mouth every 6 hours as needed for severe pain.    Historical Provider, MD  isosorbide mononitrate (IMDUR) 30 MG 24 hr tablet Take 1 tablet (30 mg total) by mouth daily. 02/04/16   Rodolph Bonganiel V Thompson, MD  oxyCODONE (OXY IR/ROXICODONE) 5 MG immediate  release tablet Take 1 tablet (5 mg total) by mouth every 6 (six) hours as needed for breakthrough pain. 02/04/16   Rodolph Bonganiel V Thompson, MD  predniSONE (STERAPRED UNI-PAK 21 TAB) 10 MG (21) TBPK tablet Take 1 tablet (10 mg total) by mouth daily. Take 6 tabs day 1, 5 tabs day 2, 4 tabs day 3, 3 tabs day 4, 2 tabs day 5, and 1 tab on day 6. 09/21/16   Shawn C Joy, PA-C  risperiDONE (RISPERDAL) 1 MG tablet Take 1 mg by mouth at bedtime.    Historical Provider, MD    Family History Family History  Problem Relation Age of Onset  . Family history unknown: Yes    Social History Social History  Substance Use Topics  . Smoking status: Current Every Day Smoker    Packs/day: 0.50    Types: Cigarettes  . Smokeless tobacco: Never Used  . Alcohol use Yes     Comment: occasional     Allergies   Codeine   Review of Systems Review of Systems  Constitutional: Negative for fever.  Respiratory: Positive for cough, chest tightness and wheezing.      Physical Exam Updated Vital Signs BP 126/83 (BP Location: Left Arm)   Pulse 106   Temp 98.2 F (36.8 C) (Oral)   Resp (!) 27   Ht 5'  4" (1.626 m)   Wt 200 lb (90.7 kg)   SpO2 96%   BMI 34.33 kg/m   Physical Exam  Constitutional: She is oriented to person, place, and time. She appears well-developed and well-nourished. No distress.  HENT:  Head: Normocephalic and atraumatic.  Eyes: Pupils are equal, round, and reactive to light.  Neck: Normal range of motion.  Cardiovascular: Normal rate and intact distal pulses.   Pulmonary/Chest: Accessory muscle usage present. Tachypnea noted. She is in respiratory distress. She has wheezes.  Abdominal: Normal appearance. She exhibits no distension.  Musculoskeletal: Normal range of motion.  Neurological: She is alert and oriented to person, place, and time. No cranial nerve deficit.  Skin: Skin is warm and dry. No rash noted.  Psychiatric: She has a normal mood and affect. Her behavior is normal.    Nursing note and vitals reviewed.    ED Treatments / Results  Labs (all labs ordered are listed, but only abnormal results are displayed) Labs Reviewed  COMPREHENSIVE METABOLIC PANEL - Abnormal; Notable for the following:       Result Value   Glucose, Bld 119 (*)    All other components within normal limits  ETHANOL  SALICYLATE LEVEL  ACETAMINOPHEN LEVEL  CBC  RAPID URINE DRUG SCREEN, HOSP PERFORMED    EKG  EKG Interpretation None       Radiology Dg Chest 2 View  Result Date: 10/22/2016 CLINICAL DATA:  Productive cough. EXAM: CHEST  2 VIEW COMPARISON:  09/24/2016 FINDINGS: Mild patient rotation. The cardiomediastinal contours are normal. The lungs are clear. Pulmonary vasculature is normal. No consolidation, pleural effusion, or pneumothorax. No acute osseous abnormalities are seen. IMPRESSION: No active cardiopulmonary disease. Electronically Signed   By: Rubye OaksMelanie  Ehinger M.D.   On: 10/22/2016 20:32    Procedures Procedures (including critical care time)  Medications Ordered in ED Medications  albuterol (PROVENTIL) (2.5 MG/3ML) 0.083% nebulizer solution 5 mg (5 mg Nebulization Given 10/22/16 2052)  albuterol (PROVENTIL) (2.5 MG/3ML) 0.083% nebulizer solution 5 mg (5 mg Nebulization Given 10/22/16 2211)  ipratropium (ATROVENT) nebulizer solution 0.5 mg (0.5 mg Nebulization Given 10/22/16 2211)  methylPREDNISolone sodium succinate (SOLU-MEDROL) 125 mg/2 mL injection 125 mg (125 mg Intravenous Given 10/22/16 2235)     Initial Impression / Assessment and Plan / ED Course  I have reviewed the triage vital signs and the nursing notes.  Pertinent labs & imaging results that were available during my care of the patient were reviewed by me and considered in my medical decision making (see chart for details).  Clinical Course       Final Clinical Impressions(s) / ED Diagnoses   Final diagnoses:  Bronchitis    New Prescriptions New Prescriptions   No medications on  file     Nelva Nayobert Trudee Chirino, MD 10/22/16 2307

## 2016-10-22 NOTE — ED Notes (Signed)
Informed patient that when respiratory therapist is done, she needs to put on scrubs.

## 2016-10-22 NOTE — ED Triage Notes (Signed)
Pt bib [facility] d/t b/l foot pain as well as productive cough that started last night.  Pt is talking to people that are not there.  Per EMS pt reported that she was, "Off her meds."  Pt would not tell EMS what medications that she has been off of are.

## 2016-10-22 NOTE — ED Notes (Signed)
No respiratory or acute distress noted alert and talking call light in reach. 

## 2016-10-23 DIAGNOSIS — J441 Chronic obstructive pulmonary disease with (acute) exacerbation: Secondary | ICD-10-CM | POA: Diagnosis not present

## 2016-10-23 DIAGNOSIS — F17210 Nicotine dependence, cigarettes, uncomplicated: Secondary | ICD-10-CM | POA: Diagnosis present

## 2016-10-23 DIAGNOSIS — Z888 Allergy status to other drugs, medicaments and biological substances status: Secondary | ICD-10-CM

## 2016-10-23 DIAGNOSIS — I10 Essential (primary) hypertension: Secondary | ICD-10-CM | POA: Diagnosis not present

## 2016-10-23 DIAGNOSIS — G9340 Encephalopathy, unspecified: Secondary | ICD-10-CM | POA: Diagnosis present

## 2016-10-23 DIAGNOSIS — J449 Chronic obstructive pulmonary disease, unspecified: Secondary | ICD-10-CM | POA: Insufficient documentation

## 2016-10-23 DIAGNOSIS — Z79899 Other long term (current) drug therapy: Secondary | ICD-10-CM

## 2016-10-23 DIAGNOSIS — J209 Acute bronchitis, unspecified: Secondary | ICD-10-CM | POA: Diagnosis not present

## 2016-10-23 DIAGNOSIS — Z9119 Patient's noncompliance with other medical treatment and regimen: Secondary | ICD-10-CM

## 2016-10-23 DIAGNOSIS — Z9114 Patient's other noncompliance with medication regimen: Secondary | ICD-10-CM | POA: Diagnosis not present

## 2016-10-23 DIAGNOSIS — Z590 Homelessness: Secondary | ICD-10-CM | POA: Diagnosis not present

## 2016-10-23 DIAGNOSIS — J440 Chronic obstructive pulmonary disease with acute lower respiratory infection: Secondary | ICD-10-CM | POA: Diagnosis present

## 2016-10-23 DIAGNOSIS — R05 Cough: Secondary | ICD-10-CM | POA: Diagnosis present

## 2016-10-23 DIAGNOSIS — F200 Paranoid schizophrenia: Secondary | ICD-10-CM | POA: Diagnosis present

## 2016-10-23 LAB — URINALYSIS, ROUTINE W REFLEX MICROSCOPIC
BILIRUBIN URINE: NEGATIVE
Glucose, UA: NEGATIVE mg/dL
Ketones, ur: NEGATIVE mg/dL
LEUKOCYTES UA: NEGATIVE
Nitrite: NEGATIVE
PROTEIN: NEGATIVE mg/dL
SPECIFIC GRAVITY, URINE: 1.023 (ref 1.005–1.030)
pH: 5 (ref 5.0–8.0)

## 2016-10-23 LAB — RAPID URINE DRUG SCREEN, HOSP PERFORMED
AMPHETAMINES: NOT DETECTED
BARBITURATES: NOT DETECTED
BENZODIAZEPINES: NOT DETECTED
Cocaine: NOT DETECTED
Opiates: NOT DETECTED
TETRAHYDROCANNABINOL: NOT DETECTED

## 2016-10-23 LAB — BASIC METABOLIC PANEL
Anion gap: 6 (ref 5–15)
BUN: 18 mg/dL (ref 6–20)
CHLORIDE: 109 mmol/L (ref 101–111)
CO2: 26 mmol/L (ref 22–32)
Calcium: 8.6 mg/dL — ABNORMAL LOW (ref 8.9–10.3)
Creatinine, Ser: 0.54 mg/dL (ref 0.44–1.00)
GFR calc Af Amer: >60 mL/min (ref >60)
GLUCOSE: 139 mg/dL — AB (ref 65–99)
POTASSIUM: 3.8 mmol/L (ref 3.5–5.1)
Sodium: 141 mmol/L (ref 135–145)

## 2016-10-23 LAB — CBC
HEMATOCRIT: 37.6 % (ref 36.0–46.0)
HEMOGLOBIN: 12.2 g/dL (ref 12.0–15.0)
MCH: 29.3 pg (ref 26.0–34.0)
MCHC: 32.4 g/dL (ref 30.0–36.0)
MCV: 90.2 fL (ref 78.0–100.0)
PLATELETS: 179 10*3/uL (ref 150–400)
RBC: 4.17 MIL/uL (ref 3.87–5.11)
RDW: 13.3 % (ref 11.5–15.5)
WBC: 8 10*3/uL (ref 4.0–10.5)

## 2016-10-23 LAB — INFLUENZA PANEL BY PCR (TYPE A & B)
Influenza A By PCR: NEGATIVE
Influenza B By PCR: NEGATIVE

## 2016-10-23 LAB — TROPONIN I

## 2016-10-23 LAB — AMMONIA: Ammonia: 23 umol/L (ref 9–35)

## 2016-10-23 MED ORDER — FOLIC ACID 1 MG PO TABS
1.0000 mg | ORAL_TABLET | Freq: Every day | ORAL | Status: DC
  Administered 2016-10-23 – 2016-10-24 (×2): 1 mg via ORAL
  Filled 2016-10-23 (×2): qty 1

## 2016-10-23 MED ORDER — LORAZEPAM 1 MG PO TABS: 1.0000 mg | ORAL_TABLET | Freq: Four times a day (QID) | ORAL | Status: DC | PRN

## 2016-10-23 MED ORDER — LORAZEPAM 2 MG/ML IJ SOLN: 1.0000 mg | Freq: Four times a day (QID) | INTRAMUSCULAR | Status: DC | PRN

## 2016-10-23 MED ORDER — AMLODIPINE BESYLATE 5 MG PO TABS
5.0000 mg | ORAL_TABLET | Freq: Every day | ORAL | Status: DC
  Administered 2016-10-23 – 2016-10-24 (×2): 5 mg via ORAL
  Filled 2016-10-23 (×2): qty 1

## 2016-10-23 MED ORDER — ONDANSETRON HCL 4 MG PO TABS: 4.0000 mg | ORAL_TABLET | Freq: Four times a day (QID) | ORAL | Status: DC | PRN

## 2016-10-23 MED ORDER — RISPERIDONE 1 MG PO TABS
1.0000 mg | ORAL_TABLET | Freq: Two times a day (BID) | ORAL | Status: DC
  Administered 2016-10-23 – 2016-10-24 (×3): 1 mg via ORAL
  Filled 2016-10-23 (×3): qty 1

## 2016-10-23 MED ORDER — VITAMIN B-1 100 MG PO TABS
100.0000 mg | ORAL_TABLET | Freq: Every day | ORAL | Status: DC
  Administered 2016-10-23 – 2016-10-24 (×2): 100 mg via ORAL
  Filled 2016-10-23 (×2): qty 1

## 2016-10-23 MED ORDER — ADULT MULTIVITAMIN W/MINERALS CH
1.0000 | ORAL_TABLET | Freq: Every day | ORAL | Status: DC
  Administered 2016-10-23 – 2016-10-24 (×2): 1 via ORAL
  Filled 2016-10-23 (×2): qty 1

## 2016-10-23 MED ORDER — SODIUM CHLORIDE 0.9% FLUSH
3.0000 mL | Freq: Two times a day (BID) | INTRAVENOUS | Status: DC
  Administered 2016-10-23 – 2016-10-24 (×2): 3 mL via INTRAVENOUS

## 2016-10-23 MED ORDER — ORAL CARE MOUTH RINSE
15.0000 mL | Freq: Two times a day (BID) | OROMUCOSAL | Status: DC
  Administered 2016-10-23 – 2016-10-24 (×2): 15 mL via OROMUCOSAL

## 2016-10-23 MED ORDER — BENZTROPINE MESYLATE 0.5 MG PO TABS
0.5000 mg | ORAL_TABLET | Freq: Two times a day (BID) | ORAL | Status: DC
  Administered 2016-10-23 – 2016-10-24 (×3): 0.5 mg via ORAL
  Filled 2016-10-23 (×3): qty 1

## 2016-10-23 MED ORDER — IPRATROPIUM-ALBUTEROL 0.5-2.5 (3) MG/3ML IN SOLN
3.0000 mL | Freq: Two times a day (BID) | RESPIRATORY_TRACT | Status: DC
  Administered 2016-10-23: 3 mL via RESPIRATORY_TRACT
  Filled 2016-10-23: qty 3

## 2016-10-23 MED ORDER — PANTOPRAZOLE SODIUM 40 MG PO TBEC
40.0000 mg | DELAYED_RELEASE_TABLET | Freq: Every day | ORAL | Status: DC
  Administered 2016-10-23: 40 mg via ORAL
  Filled 2016-10-23: qty 1

## 2016-10-23 MED ORDER — ONDANSETRON HCL 4 MG/2ML IJ SOLN
4.0000 mg | Freq: Four times a day (QID) | INTRAMUSCULAR | Status: DC | PRN
  Administered 2016-10-23: 4 mg via INTRAVENOUS
  Filled 2016-10-23: qty 2

## 2016-10-23 MED ORDER — RISPERIDONE 1 MG PO TABS: 1.0000 mg | ORAL_TABLET | Freq: Every day | ORAL | Status: DC

## 2016-10-23 MED ORDER — ALBUTEROL SULFATE (2.5 MG/3ML) 0.083% IN NEBU: 2.5000 mg | INHALATION_SOLUTION | RESPIRATORY_TRACT | Status: DC | PRN

## 2016-10-23 MED ORDER — ISOSORBIDE MONONITRATE ER 30 MG PO TB24
30.0000 mg | ORAL_TABLET | Freq: Every day | ORAL | Status: DC
  Administered 2016-10-23 – 2016-10-24 (×2): 30 mg via ORAL
  Filled 2016-10-23 (×2): qty 1

## 2016-10-23 MED ORDER — ALBUTEROL SULFATE (2.5 MG/3ML) 0.083% IN NEBU: 2.5000 mg | INHALATION_SOLUTION | Freq: Four times a day (QID) | RESPIRATORY_TRACT | Status: DC

## 2016-10-23 MED ORDER — PANTOPRAZOLE SODIUM 40 MG IV SOLR
40.0000 mg | Freq: Every day | INTRAVENOUS | Status: DC
  Administered 2016-10-23: 40 mg via INTRAVENOUS
  Filled 2016-10-23: qty 40

## 2016-10-23 MED ORDER — ENOXAPARIN SODIUM 40 MG/0.4ML ~~LOC~~ SOLN
40.0000 mg | Freq: Every day | SUBCUTANEOUS | Status: DC
  Administered 2016-10-23 (×2): 40 mg via SUBCUTANEOUS
  Filled 2016-10-23 (×2): qty 0.4

## 2016-10-23 MED ORDER — ACETAMINOPHEN 325 MG PO TABS
650.0000 mg | ORAL_TABLET | Freq: Four times a day (QID) | ORAL | Status: DC | PRN
  Administered 2016-10-24: 650 mg via ORAL
  Filled 2016-10-23 (×2): qty 2

## 2016-10-23 MED ORDER — THIAMINE HCL 100 MG/ML IJ SOLN: 100.0000 mg | Freq: Every day | INTRAMUSCULAR | Status: DC

## 2016-10-23 MED ORDER — METHYLPREDNISOLONE SODIUM SUCC 125 MG IJ SOLR
60.0000 mg | Freq: Two times a day (BID) | INTRAMUSCULAR | Status: DC
  Administered 2016-10-23 – 2016-10-24 (×3): 60 mg via INTRAVENOUS
  Filled 2016-10-23 (×3): qty 2

## 2016-10-23 MED ORDER — DOXYCYCLINE HYCLATE 100 MG IV SOLR
100.0000 mg | Freq: Two times a day (BID) | INTRAVENOUS | Status: DC
  Administered 2016-10-23 (×2): 100 mg via INTRAVENOUS
  Filled 2016-10-23 (×3): qty 100

## 2016-10-23 MED ORDER — IPRATROPIUM BROMIDE 0.02 % IN SOLN: 0.5000 mg | Freq: Four times a day (QID) | RESPIRATORY_TRACT | Status: DC

## 2016-10-23 MED ORDER — ACETAMINOPHEN 650 MG RE SUPP: 650.0000 mg | Freq: Four times a day (QID) | RECTAL | Status: DC | PRN

## 2016-10-23 MED ORDER — LEVOFLOXACIN IN D5W 500 MG/100ML IV SOLN
500.0000 mg | Freq: Every day | INTRAVENOUS | Status: DC
  Administered 2016-10-23: 500 mg via INTRAVENOUS
  Filled 2016-10-23: qty 100

## 2016-10-23 MED ORDER — IPRATROPIUM-ALBUTEROL 0.5-2.5 (3) MG/3ML IN SOLN
3.0000 mL | Freq: Four times a day (QID) | RESPIRATORY_TRACT | Status: DC
  Administered 2016-10-23: 3 mL via RESPIRATORY_TRACT
  Filled 2016-10-23: qty 3

## 2016-10-23 MED ORDER — SODIUM CHLORIDE 0.9 % IV SOLN
Freq: Once | INTRAVENOUS | Status: AC
  Administered 2016-10-23: 02:00:00 via INTRAVENOUS

## 2016-10-23 NOTE — Progress Notes (Signed)
Patient arrived to floor from emergency room.  Shortly after patient arrived to floor, bed placement called to inform us that patient needed to go to a telemetry floor. Patient was pended to room 1414.  After patient was given a bath and assessed, report was given and she was transported to 4th floor.  RN was made aware that ED RN needs to come perform a swallow evaluation prior to eating or drinking.  Patient is alert and oriented x4 but is paranoid and has disorganized ideas and thoughts.  Patient reports being homeless and occasionally living in [place] rooms.  Her appearance is disheveled and unkept.Kenton KingfisherMills, Maylie Ashton [place]

## 2016-10-23 NOTE — Progress Notes (Signed)
This RT attempted abg x1 but was unsuccessful.  Pt was tense and could not relax hand during procedure.  Pt stated it hurt too much and refused further attempts at this time.  RN aware.

## 2016-10-23 NOTE — Progress Notes (Addendum)
[facility] PROGRESS NOTE    Progress Note  Jasmine SneddonSherry L Nguyen  [id number] DOB: [birth year] DOA: 10/22/2016 PCP: Kirt Boysarter, Monica, DO     Brief Narrative:   [name] is an 55 y.o. female past medical history of paranoid schizophrenia, alcohol use and tobacco who presents with complaints of cough and lethargy  Assessment/Plan:   COPD exacerbation (HCC) due to acute bronchitis She was having a productive cough and wheezing on admission chest x-ray showed no active disease. Discontinue telemetry, continue Duonebs, IV Solu-Medrol and IV antibiotics. Influenza PCR is negative, UDS is negative.  Acute encephalopathy Alcohol was negative, questionably due to infectious etiology. ? Due to psyquiatric disorder  Paranoid schizophrenia, chronic condition (HCC) She is Off her psychiatric medications will resume her risperidone. I will consulted psychiatry awaiting recommendations.  Non-compliance with treatment  Tobacco abuse: Counseling was performed.    Essential hypertension: Pressure was high resume Imdur and Norvasc.     DVT prophylaxis: lovenox Family Communication:none Disposition Plan/Barrier to D/C: home in 2-3 days Code Status:     Code Status Orders        Start     Ordered   10/23/16 0002  Full code  Continuous     10/23/16 0013    Code Status History    Date Active Date Inactive Code Status Order ID Comments User Context   10/22/2016  9:52 PM 10/22/2016  9:55 PM Full Code [id number]  Nelva Nayobert Beaton, MD ED   01/27/2016  1:57 PM 02/04/2016  7:54 PM Full Code [id number]  Alison MurrayAlma M Devine, MD ED   02/09/2012  3:01 PM 02/10/2012  4:26 PM Full Code [id number]  Lottie Musselatyana A Kirichenko, PA ED        IV Access:    Peripheral IV   Procedures and diagnostic studies:   Dg Chest 2 View  Result Date: 10/22/2016 CLINICAL DATA:  Productive cough. EXAM: CHEST  2 VIEW COMPARISON:  09/24/2016 FINDINGS: Mild patient rotation. The cardiomediastinal contours are normal.  The lungs are clear. Pulmonary vasculature is normal. No consolidation, pleural effusion, or pneumothorax. No acute osseous abnormalities are seen. IMPRESSION: No active cardiopulmonary disease. Electronically Signed   By: Rubye OaksMelanie  Ehinger M.D.   On: 10/22/2016 20:32     Medical Consultants:    None.  Anti-Infectives:   none  Subjective:    [name] is not making any sense when she talks.  Objective:    Vitals:   10/22/16 2300 10/23/16 0035 10/23/16 0135 10/23/16 0520  BP: 128/63 (!) 163/91 (!) 148/71 (!) 166/93  Pulse: 107 (!) 104 100 (!) 107  Resp: 24 20 20  (!) 24  Temp:  98.9 F (37.2 C) 98.3 F (36.8 C) 98.6 F (37 C)  TempSrc:  Oral Oral Oral  SpO2: 96% 96% 95% 98%  Weight:  92.6 kg (204 lb 2.3 oz) 92.5 kg (203 lb 14.8 oz)   Height:  5\' 3"  (1.6 m) 5\' 3"  (1.6 m)     Intake/Output Summary (Last 24 hours) at 10/23/16 0734 Last data filed at 10/23/16 0250  Gross per 24 hour  Intake                0 ml  Output              303 ml  Net             -303 ml   Filed Weights   10/22/16 2032 10/23/16 0035 10/23/16 0135  Weight: 90.7 kg (200 lb)  92.6 kg (204 lb 2.3 oz) 92.5 kg (203 lb 14.8 oz)    Exam: General exam: In no acute distress. Respiratory system: Good air movement and clear to auscultation. Cardiovascular system: S1 & S2 heard, RRR. No JVD. Gastrointestinal system: Abdomen is nondistended, soft and nontender.  Central nervous system: Alert and oriented. No focal neurological deficits. Extremities: No pedal edema. Skin: No rashes, lesions or ulcers Psychiatry: Judgement and insight appear normal. Mood & affect appropriate.    Data Reviewed:    Labs: Basic Metabolic Panel:  Recent Labs Lab 10/22/16 2024 10/23/16 0134  NA 142 141  K 3.9 3.8  CL 107 109  CO2 28 26  GLUCOSE 119* 139*  BUN 17 18  CREATININE 0.53 0.54  CALCIUM 8.9 8.6*   GFR Estimated Creatinine Clearance: 86.8 mL/min (by C-G formula based on SCr of 0.54  mg/dL). Liver Function Tests:  Recent Labs Lab 10/22/16 2024  AST 20  ALT 16  ALKPHOS 75  BILITOT 0.5  PROT 7.2  ALBUMIN 3.7   No results for input(s): LIPASE, AMYLASE in the last 168 hours.  Recent Labs Lab 10/23/16 0134  AMMONIA 23   Coagulation profile No results for input(s): INR, PROTIME in the last 168 hours.  CBC:  Recent Labs Lab 10/22/16 2024 10/23/16 0134  WBC 8.3 8.0  HGB 13.1 12.2  HCT 41.9 37.6  MCV 93.3 90.2  PLT 196 179   Cardiac Enzymes:  Recent Labs Lab 10/23/16 0134  TROPONINI <0.03   BNP (last 3 results) No results for input(s): PROBNP in the last 8760 hours. CBG: No results for input(s): GLUCAP in the last 168 hours. D-Dimer: No results for input(s): DDIMER in the last 72 hours. Hgb A1c: No results for input(s): HGBA1C in the last 72 hours. Lipid Profile: No results for input(s): CHOL, HDL, LDLCALC, TRIG, CHOLHDL, LDLDIRECT in the last 72 hours. Thyroid function studies: No results for input(s): TSH, T4TOTAL, T3FREE, THYROIDAB in the last 72 hours.  Invalid input(s): FREET3 Anemia work up: No results for input(s): VITAMINB12, FOLATE, FERRITIN, TIBC, IRON, RETICCTPCT in the last 72 hours. Sepsis Labs:  Recent Labs Lab 10/22/16 2024 10/23/16 0134  WBC 8.3 8.0   Microbiology No results found for this or any previous visit (from the past 240 hour(s)).   Medications:   . enoxaparin (LOVENOX) injection  40 mg Subcutaneous QHS  . folic acid  1 mg Oral Daily  . ipratropium-albuterol  3 mL Nebulization QID  . levofloxacin (LEVAQUIN) IV  500 mg Intravenous QHS  . mouth rinse  15 mL Mouth Rinse BID  . methylPREDNISolone (SOLU-MEDROL) injection  60 mg Intravenous Q12H  . multivitamin with minerals  1 tablet Oral Daily  . pantoprazole (PROTONIX) IV  40 mg Intravenous QHS  . sodium chloride flush  3 mL Intravenous Q12H  . thiamine  100 mg Oral Daily   Or  . thiamine  100 mg Intravenous Daily   Continuous Infusions:  Time  spent: 25 min   LOS: 0 days   Marinda ElkFELIZ ORTIZ, Alfredia Desanctis  [facility] Pager [phone]  *Please refer to [email or web address], password [email or web address] to get updated schedule on who will round on this patient, as hospitalists switch teams weekly. If 7PM-7AM, please contact night-coverage at [email or web address], password [email or web address] for any overnight needs.  10/23/2016, 7:34 AM

## 2016-10-23 NOTE — Progress Notes (Signed)
PHARMACIST - PHYSICIAN COMMUNICATION  DR:   David StallFeliz-Ortiz  CONCERNING: IV to Oral Route Change Policy  RECOMMENDATION: This patient is receiving Protonix by the intravenous route.  Based on criteria approved by the Pharmacy and Therapeutics Committee, the intravenous medication(s) is/are being converted to the equivalent oral dose form(s).   DESCRIPTION: These criteria include:  The patient is eating (either orally or via tube) and/or has been taking other orally administered medications for a least 24 hours  The patient has no evidence of active gastrointestinal bleeding or impaired GI absorption (gastrectomy, short bowel, patient on TNA or NPO).  If you have questions about this conversion, please contact the Pharmacy Department  []   [phone]( [phone] )  [name] []   [phone]( [phone] )  [name] []   [phone]( [phone] )  [name] []   [phone]( [phone] )  [name] [x]   [phone]( [phone] )  [name]   Jasmine BellowsGreen, [name], [facility] CenterRPH 10/23/2016 2:22 PM

## 2016-10-23 NOTE — Consult Note (Signed)
[facility] Consult   Reason for Consult:  Paranoia Referring Physician:  Dr. Venetia Constable Patient Identification: [name] MRN:  [id number] Principal Diagnosis: Paranoid schizophrenia, chronic condition ([place]) Diagnosis:   Patient Active Problem List   Diagnosis Date Noted  . COPD (chronic obstructive pulmonary disease) ([place]) [J44.9] 10/23/2016  . Acute encephalopathy [G93.40] 10/23/2016  . Acute bronchitis [J20.9] 10/23/2016  . COPD exacerbation ([place]) [J44.1] 10/22/2016  . CAP (community acquired pneumonia) [J18.9]   . Influenza A [J10.1]   . Essential hypertension [I10]   . Right hip pain [M25.551]   . Paranoid schizophrenia, chronic condition ([place]) [F20.0] 01/28/2016  . Non-compliance with treatment [Z91.19] 01/28/2016  . Acute respiratory failure with hypoxia ([place]) [J96.01] 01/27/2016  . Lobar pneumonia, unspecified organism ([place]) [J18.1] 01/27/2016  . History of schizophrenia [Z86.59] 01/27/2016    Total Time spent with patient: 1 hour  Subjective:   [name] is a 55 y.o. female patient admitted with cough.  HPI: Patient with history of Paranoid schizophrenia who was admitted to medical floor due to productive cough. Patient is a poor historian but chart reviewed indicate a long history of Paranoid Schizophrenia. Patient presents with delusions, talking about people stealing her house and would like to be placed in group home. She also reports that people have been taping her phone and making her phone talk to people she does not like. Her behavior is bizarre and she talks to herself constantly as if responding to internal stimuli. Patient reports she has been off her medication(Risperdal) for a long time. She denies SI/HI, drugs or alcohol abuse.  Past Psychiatric History: as above  Risk to Self: Is patient at risk for suicide?: No Risk to Others:   Prior Inpatient Therapy:   Prior Outpatient Therapy:    Past Medical History:  Past Medical History:   Diagnosis Date  . Paranoid schizophrenia ([place])    History reviewed. No pertinent surgical history. Family History:  Family History  Problem Relation Age of Onset  . Family history unknown: Yes   Family Psychiatric  History:  Social History:  History  Alcohol Use  . Yes    Comment: occasional     History  Drug Use No    Social History   Social History  . Marital status: Single    Spouse name: N/A  . Number of children: N/A  . Years of education: N/A   Social History Main Topics  . Smoking status: Current Every Day Smoker    Packs/day: 0.50    Types: Cigarettes  . Smokeless tobacco: Never Used  . Alcohol use Yes     Comment: occasional  . Drug use: No  . Sexual activity: Not Asked   Other Topics Concern  . None   Social History Narrative  . None   Additional Social History:    Allergies:   Allergies  Allergen Reactions  . Codeine Other (See Comments)    Makes her sleepy    Labs:  Results for orders placed or performed during the hospital encounter of 10/22/16 (from the past 48 hour(s))  Comprehensive metabolic panel     Status: Abnormal   Collection Time: 10/22/16  8:24 PM  Result Value Ref Range   Sodium 142 135 - 145 mmol/L   Potassium 3.9 3.5 - 5.1 mmol/L   Chloride 107 101 - 111 mmol/L   CO2 28 22 - 32 mmol/L   Glucose, Bld 119 (H) 65 - 99 mg/dL   BUN 17 6 -  20 mg/dL   Creatinine, Ser 0.53 0.44 - 1.00 mg/dL   Calcium 8.9 8.9 - 10.3 mg/dL   Total Protein 7.2 6.5 - 8.1 g/dL   Albumin 3.7 3.5 - 5.0 g/dL   AST 20 15 - 41 U/L   ALT 16 14 - 54 U/L   Alkaline Phosphatase 75 38 - 126 U/L   Total Bilirubin 0.5 0.3 - 1.2 mg/dL   GFR calc non Af Amer >60 >60 mL/min   GFR calc Af Amer >60 >60 mL/min    Comment: (NOTE) The eGFR has been calculated using the CKD EPI equation. This calculation has not been validated in all clinical situations. eGFR's persistently <60 mL/min signify possible Chronic Kidney Disease.    Anion gap 7 5 - 15  Ethanol      Status: None   Collection Time: 10/22/16  8:24 PM  Result Value Ref Range   Alcohol, Ethyl (B) <5 <5 mg/dL    Comment:        LOWEST DETECTABLE LIMIT FOR SERUM ALCOHOL IS 5 mg/dL FOR MEDICAL PURPOSES ONLY   Salicylate level     Status: None   Collection Time: 10/22/16  8:24 PM  Result Value Ref Range   Salicylate Lvl <2.9 2.8 - 30.0 mg/dL  Acetaminophen level     Status: None   Collection Time: 10/22/16  8:24 PM  Result Value Ref Range   Acetaminophen (Tylenol), Serum 13 10 - 30 ug/mL    Comment:        THERAPEUTIC CONCENTRATIONS VARY SIGNIFICANTLY. A RANGE OF 10-30 ug/mL MAY BE AN EFFECTIVE CONCENTRATION FOR MANY PATIENTS. HOWEVER, SOME ARE BEST TREATED AT CONCENTRATIONS OUTSIDE THIS RANGE. ACETAMINOPHEN CONCENTRATIONS >150 ug/mL AT 4 HOURS AFTER INGESTION AND >50 ug/mL AT 12 HOURS AFTER INGESTION ARE OFTEN ASSOCIATED WITH TOXIC REACTIONS.   cbc     Status: None   Collection Time: 10/22/16  8:24 PM  Result Value Ref Range   WBC 8.3 4.0 - 10.5 K/uL   RBC 4.49 3.87 - 5.11 MIL/uL   Hemoglobin 13.1 12.0 - 15.0 g/dL   HCT 41.9 36.0 - 46.0 %   MCV 93.3 78.0 - 100.0 fL   MCH 29.2 26.0 - 34.0 pg   MCHC 31.3 30.0 - 36.0 g/dL   RDW 13.5 11.5 - 15.5 %   Platelets 196 150 - 400 K/uL  CBC     Status: None   Collection Time: 10/23/16  1:34 AM  Result Value Ref Range   WBC 8.0 4.0 - 10.5 K/uL   RBC 4.17 3.87 - 5.11 MIL/uL   Hemoglobin 12.2 12.0 - 15.0 g/dL   HCT 37.6 36.0 - 46.0 %   MCV 90.2 78.0 - 100.0 fL   MCH 29.3 26.0 - 34.0 pg   MCHC 32.4 30.0 - 36.0 g/dL   RDW 13.3 11.5 - 15.5 %   Platelets 179 150 - 400 K/uL  Basic metabolic panel     Status: Abnormal   Collection Time: 10/23/16  1:34 AM  Result Value Ref Range   Sodium 141 135 - 145 mmol/L   Potassium 3.8 3.5 - 5.1 mmol/L   Chloride 109 101 - 111 mmol/L   CO2 26 22 - 32 mmol/L   Glucose, Bld 139 (H) 65 - 99 mg/dL   BUN 18 6 - 20 mg/dL   Creatinine, Ser 0.54 0.44 - 1.00 mg/dL   Calcium 8.6 (L) 8.9 - 10.3  mg/dL   GFR calc non Af Amer >60 >60 mL/min  GFR calc Af Amer >60 >60 mL/min    Comment: (NOTE) The eGFR has been calculated using the CKD EPI equation. This calculation has not been validated in all clinical situations. eGFR's persistently <60 mL/min signify possible Chronic Kidney Disease.    Anion gap 6 5 - 15  Troponin I (q 6hr x 3)     Status: None   Collection Time: 10/23/16  1:34 AM  Result Value Ref Range   Troponin I <0.03 <0.03 ng/mL  Ammonia     Status: None   Collection Time: 10/23/16  1:34 AM  Result Value Ref Range   Ammonia 23 9 - 35 umol/L  Influenza panel by PCR (type A & B, H1N1)     Status: None   Collection Time: 10/23/16  1:55 AM  Result Value Ref Range   Influenza A By PCR NEGATIVE NEGATIVE   Influenza B By PCR NEGATIVE NEGATIVE    Comment: (NOTE) The Xpert Xpress Flu assay is intended as an aid in the diagnosis of  influenza and should not be used as a sole basis for treatment.  This  assay is FDA approved for nasopharyngeal swab specimens only. Nasal  washings and aspirates are unacceptable for Xpert Xpress Flu testing.   Rapid urine drug screen (hospital performed)     Status: None   Collection Time: 10/23/16  2:46 AM  Result Value Ref Range   Opiates NONE DETECTED NONE DETECTED   Cocaine NONE DETECTED NONE DETECTED   Benzodiazepines NONE DETECTED NONE DETECTED   Amphetamines NONE DETECTED NONE DETECTED   Tetrahydrocannabinol NONE DETECTED NONE DETECTED   Barbiturates NONE DETECTED NONE DETECTED    Comment:        DRUG SCREEN FOR MEDICAL PURPOSES ONLY.  IF CONFIRMATION IS NEEDED FOR ANY PURPOSE, NOTIFY LAB WITHIN 5 DAYS.        LOWEST DETECTABLE LIMITS FOR URINE DRUG SCREEN Drug Class       Cutoff (ng/mL) Amphetamine      1000 Barbiturate      200 Benzodiazepine   400 Tricyclics       867 Opiates          300 Cocaine          300 THC              50   Urinalysis, Routine w reflex microscopic     Status: Abnormal   Collection Time:  10/23/16  2:46 AM  Result Value Ref Range   Color, Urine YELLOW YELLOW   APPearance HAZY (A) CLEAR   Specific Gravity, Urine 1.023 1.005 - 1.030   pH 5.0 5.0 - 8.0   Glucose, UA NEGATIVE NEGATIVE mg/dL   Hgb urine dipstick SMALL (A) NEGATIVE   Bilirubin Urine NEGATIVE NEGATIVE   Ketones, ur NEGATIVE NEGATIVE mg/dL   Protein, ur NEGATIVE NEGATIVE mg/dL   Nitrite NEGATIVE NEGATIVE   Leukocytes, UA NEGATIVE NEGATIVE   RBC / HPF 6-30 0 - 5 RBC/hpf   WBC, UA 0-5 0 - 5 WBC/hpf   Bacteria, UA RARE (A) NONE SEEN   Squamous Epithelial / LPF 0-5 (A) NONE SEEN   Mucous PRESENT   Troponin I (q 6hr x 3)     Status: None   Collection Time: 10/23/16  7:10 AM  Result Value Ref Range   Troponin I <0.03 <0.03 ng/mL    Current Facility-Administered Medications  Medication Dose Route Frequency Provider Last Rate Last Dose  . acetaminophen (TYLENOL) tablet 650 mg  650  mg Oral Q6H PRN Norval Morton, MD       Or  . acetaminophen (TYLENOL) suppository 650 mg  650 mg Rectal Q6H PRN Norval Morton, MD      . albuterol (PROVENTIL) (2.5 MG/3ML) 0.083% nebulizer solution 2.5 mg  2.5 mg Nebulization Q2H PRN Norval Morton, MD      . amLODipine (NORVASC) tablet 5 mg  5 mg Oral Daily Charlynne Cousins, MD   5 mg at 10/23/16 0949  . benztropine (COGENTIN) tablet 0.5 mg  0.5 mg Oral BID Gloriajean Okun, MD      . doxycycline (VIBRAMYCIN) 100 mg in dextrose 5 % 250 mL IVPB  100 mg Intravenous Q12H Charlynne Cousins, MD   100 mg at 10/23/16 1443  . enoxaparin (LOVENOX) injection 40 mg  40 mg Subcutaneous QHS Norval Morton, MD   40 mg at 10/23/16 0115  . folic acid (FOLVITE) tablet 1 mg  1 mg Oral Daily Norval Morton, MD   1 mg at 10/23/16 0949  . ipratropium-albuterol (DUONEB) 0.5-2.5 (3) MG/3ML nebulizer solution 3 mL  3 mL Nebulization BID Charlynne Cousins, MD      . isosorbide mononitrate (IMDUR) 24 hr tablet 30 mg  30 mg Oral Daily Charlynne Cousins, MD   30 mg at 10/23/16 0949  . LORazepam  (ATIVAN) tablet 1 mg  1 mg Oral Q6H PRN Norval Morton, MD       Or  . LORazepam (ATIVAN) injection 1 mg  1 mg Intravenous Q6H PRN Norval Morton, MD      . MEDLINE mouth rinse  15 mL Mouth Rinse BID Charlynne Cousins, MD      . methylPREDNISolone sodium succinate (SOLU-MEDROL) 125 mg/2 mL injection 60 mg  60 mg Intravenous Q12H Norval Morton, MD   60 mg at 10/23/16 1540  . multivitamin with minerals tablet 1 tablet  1 tablet Oral Daily Norval Morton, MD   1 tablet at 10/23/16 0949  . ondansetron (ZOFRAN) tablet 4 mg  4 mg Oral Q6H PRN Norval Morton, MD       Or  . ondansetron (ZOFRAN) injection 4 mg  4 mg Intravenous Q6H PRN Norval Morton, MD   4 mg at 10/23/16 0308  . pantoprazole (PROTONIX) injection 40 mg  40 mg Intravenous QHS Norval Morton, MD   40 mg at 10/23/16 0115  . risperiDONE (RISPERDAL) tablet 1 mg  1 mg Oral BID Franz Svec, MD      . sodium chloride flush (NS) 0.9 % injection 3 mL  3 mL Intravenous Q12H Rondell A Smith, MD      . thiamine (VITAMIN B-1) tablet 100 mg  100 mg Oral Daily Rondell A Tamala Julian, MD   100 mg at 10/23/16 0867   Or  . thiamine (B-1) injection 100 mg  100 mg Intravenous Daily Norval Morton, MD        Musculoskeletal: Strength & Muscle Tone: patient refused to be tested Gait & Station: patient refused to be tested Patient leans: N/A  Psychiatric Specialty Exam: Physical Exam  Psychiatric: Judgment normal. Her affect is labile. Her speech is rapid and/or pressured. She is actively hallucinating. Thought content is paranoid and delusional. Cognition and memory are normal.    Review of Systems  Constitutional: Positive for malaise/fatigue.  HENT: Negative.   Eyes: Negative.   Respiratory: Positive for cough.   Cardiovascular: Negative.   Gastrointestinal: Negative.  Genitourinary: Negative.   Musculoskeletal: Negative.   Psychiatric/Behavioral: Positive for hallucinations.    Blood pressure (!) 168/90, pulse (!) 113, temperature  98.1 F (36.7 C), temperature source Oral, resp. rate 20, height '5\' 3"'  (1.6 m), weight 92.5 kg (203 lb 14.8 oz), SpO2 97 %.Body mass index is 36.12 kg/m.  General Appearance: Disheveled  Eye Contact:  Minimal  Speech:  Garbled and rambling  Volume:  Decreased  Mood:  Irritable  Affect:  Constricted  Thought Process:  Disorganized  Orientation:  Other:  only to place and person  Thought Content:  Illogical, Hallucinations: Auditory and Paranoid Ideation  Suicidal Thoughts:  No  Homicidal Thoughts:  No  Memory:  Immediate;   Fair Recent;   Fair Remote;   Poor  Judgement:  Impaired  Insight:  Lacking  Psychomotor Activity:  Psychomotor Retardation  Concentration:  Concentration: Fair and Attention Span: Fair  Recall:  Poor  Fund of Knowledge:  Poor  Language:  Good  Akathisia:  No  Handed:  Right  AIMS (if indicated):     Assets:  Communication Skills  ADL's:  marginal  Cognition:  WNL  Sleep:   poor     Treatment Plan Summary: Daily contact with patient to assess and evaluate symptoms and progress in treatment and Medication management  Increase Risperdal to 1 mg bid for paranoid schizophrenia Start Cogentin 0.5 mg bid for EPS prevention  Disposition: Recommend psychiatric Inpatient admission when medically cleared.  Corena Pilgrim, MD 10/23/2016 1:09 PM

## 2016-10-23 NOTE — Progress Notes (Signed)
Called by primary RN to perform a bedside swallow eval on patient.  Pt is getting neuro checks, but not on a stroke pathway.  Reviewed order by Dr. Katrinka BlazingSmith which was a care order instructing the nurse to do a bedside swallow study.  Due to [facility] not having a RN bedside swallow study protocol, I notified Dr. Katrinka BlazingSmith.  Dr. Katrinka BlazingSmith advised that the order was more to the RN to make sure the patient was alert enough to eat.  Dr. Katrinka BlazingSmith did not want a Stroke Swallow Screen on this patient.  Care order modified to reflect that instruction.  Roque CashHans C Kyshon Tolliver,RN,BSN,CCRN ICU Care Coordinator / Rapid Response Nurse

## 2016-10-24 LAB — LEGIONELLA PNEUMOPHILA SEROGP 1 UR AG: L. pneumophila Serogp 1 Ur Ag: NEGATIVE

## 2016-10-24 MED ORDER — PREDNISONE 10 MG PO TABS: ORAL_TABLET | ORAL | 0 refills | Status: DC

## 2016-10-24 MED ORDER — IPRATROPIUM-ALBUTEROL 0.5-2.5 (3) MG/3ML IN SOLN: 3.0000 mL | Freq: Four times a day (QID) | RESPIRATORY_TRACT | Status: DC | PRN

## 2016-10-24 MED ORDER — DOXYCYCLINE HYCLATE 100 MG PO TABS: 100.0000 mg | ORAL_TABLET | Freq: Two times a day (BID) | ORAL | 0 refills | Status: DC

## 2016-10-24 MED ORDER — BENZTROPINE MESYLATE 0.5 MG PO TABS: 0.5000 mg | ORAL_TABLET | Freq: Two times a day (BID) | ORAL | 0 refills | Status: DC

## 2016-10-24 MED ORDER — DOXYCYCLINE HYCLATE 100 MG PO TABS
100.0000 mg | ORAL_TABLET | Freq: Two times a day (BID) | ORAL | Status: DC
  Administered 2016-10-24: 100 mg via ORAL
  Filled 2016-10-24: qty 1

## 2016-10-24 MED ORDER — RISPERIDONE 1 MG PO TABS: 1.0000 mg | ORAL_TABLET | Freq: Two times a day (BID) | ORAL | 0 refills | Status: DC

## 2016-10-24 NOTE — Care Management Note (Signed)
Case Management Note  Patient Details  Name: [name] MRN: [id number] Date oArelia Sneddonf Birth: 30-May-1961  Subjective/Objective:  55 y/o f admitted w/Paranoid Schizophrenia. Homeless. Psych-IP Psych.Psych Medically stable-d/c order.CSW following.                  Action/Plan:d/c IP Psych.    Expected Discharge Date:                  Expected Discharge Plan:  Psychiatric Hospital  In-House Referral:  Clinical Social Work  Discharge planning Services  CM Consult  Post Acute Care Choice:    Choice offered to:     DME Arranged:    DME Agency:     HH Arranged:    HH Agency:     Status of Service:  Completed, signed off  If discussed at [employer] Meetings, dates discussed:    Additional Comments:  Lanier ClamMahabir, Ugochukwu Chichester, RN 10/24/2016, 9:26 AM

## 2016-10-24 NOTE — Evaluation (Signed)
Physical Therapy Evaluation Patient Details Name: [name] [place] MRN: [id number] DOB: Mar 17, 1961 Today's Date: 10/24/2016   History of Present Illness   [name] [place] is a 55 y.o. female with medical history significant of paranoid schizophrenia, alcohol use, and tobacco abuse; who presents for complaints of cough  Clinical Impression  Pt admitted with above diagnosis. Pt currently with functional limitations due to the deficits listed below (see PT Problem List). * Pt will benefit from skilled PT to increase their independence and safety with mobility to allow discharge to the venue listed below.   Will continue to follow in acute setting, [facility] on progress     Follow Up Recommendations No PT follow up ([facility] back to her baseline at D/C)    Equipment Recommendations  Rolling walker with 5" wheels;None recommended by PT (depending on progress)    Recommendations for Other Services       Precautions / Restrictions Precautions Precautions: Fall Restrictions Weight Bearing Restrictions: No      Mobility  Bed Mobility Overal bed mobility: Needs Assistance Bed Mobility: Supine to Sit     Supine to sit: Supervision     General bed mobility comments: cues for task completion (internal distractions)  Transfers Overall transfer level: Needs assistance Equipment used: Rolling walker (2 wheeled) Transfers: Sit to/from [employer]Stand;Stand Pivot Transfers Sit to Stand: Min assist Stand pivot transfers: Min assist       General transfer comment: assist to rise, stabilize, cues for safe techniques  Ambulation/Gait Ambulation/Gait assistance: Min assist Ambulation Distance (Feet): 5 Feet Assistive device: Rolling walker (2 wheeled) Gait Pattern/deviations: Step-through pattern;Decreased stride length;Wide base of support     General Gait Details: cues for RW safety, posture and to stay on task  Stairs            Wheelchair  Mobility    Modified Rankin (Stroke Patients Only)       Balance Overall balance assessment: Needs assistance   Sitting balance-Leahy Scale: Good       Standing balance-Leahy Scale: Poor                               Pertinent Vitals/Pain Pain Assessment: Faces Faces Pain Scale: No hurt    Home Living Family/patient expects to be discharged to:: Shelter/Homeless                      Prior Function Level of Independence: Independent               Hand Dominance        Extremity/Trunk Assessment   Upper Extremity Assessment: Overall WFL for tasks assessed           Lower Extremity Assessment: Overall WFL for tasks assessed;RLE deficits/detail RLE Deficits / Details: pt verbalizes " righ tleg weakness" does not appear functionally weak       Communication   Communication: No difficulties  Cognition Arousal/Alertness: Awake/alert Behavior During Therapy: Flat affect Overall Cognitive Status: History of cognitive impairments - at baseline Area of Impairment: Safety/judgement;Problem solving;Attention;Following commands   Current Attention Level: Focused   Following Commands: Follows one step commands with increased time Safety/Judgement: Decreased awareness of safety   Problem Solving: Requires verbal cues      General Comments      Exercises     Assessment/Plan    PT Assessment Patient needs continued PT  services  PT Problem List Decreased activity tolerance;Decreased mobility;Decreased knowledge of use of DME;Decreased safety awareness;Decreased cognition          PT Treatment Interventions DME instruction;Gait training;Functional mobility training;Therapeutic exercise;Therapeutic activities;Patient/family education    PT Goals (Current goals can be found in the Care Plan section)  Acute Rehab PT Goals PT Goal Formulation: Patient unable to participate in goal setting Time For Goal Achievement: 11/07/16 Potential  to Achieve Goals: Good    Frequency Min 3X/week   Barriers to discharge        Co-evaluation               End of Session Equipment Utilized During Treatment: Gait belt Activity Tolerance: Patient tolerated treatment well Patient left: with call bell/phone within reach;in chair;with chair alarm set           Time: 1011-1025 PT Time Calculation (min) (ACUTE ONLY): 14 min   Charges:   PT Evaluation $PT Eval Low Complexity: 1 Procedure     PT G Codes:        Charity Tessier 10/24/2016, 10:35 AM

## 2016-10-24 NOTE — Progress Notes (Signed)
LCSWA assisting with patient disposition to inpatient psych. Facility. Will notify medical staff when bed available.   Vivi BarrackNicole Marieann Zipp, Theresia MajorsLCSWA, MSW Clinical Social Worker 5E and Psychiatric Service Line [phone] 10/24/2016  10:18 AM

## 2016-10-24 NOTE — Progress Notes (Addendum)
IVC completed and patient served.  LCSWA met with patient at bedside, patient unable to comprehend LCSWA as she continues to answer to internal stimuli. LCSWA attempted to explain patient will discharge to [facility]. Patient reports she did not want to go because " they are trying to take piceces of the baby out and I cannot not find my credit card with my 843 dollars on it"  Requested this writer look for the credit card. LCSWA attempted to explain patient is under IVC for her safety.  Patient still insist she is not going to [facility].  RN notified. Reports patient has not been issue or combative. Sheriff called for transport and educated about patient internal stimuli, for safety to bring two transporters.  LCSWA provided RN with report number.  IVC paperwork left with RN.  No other needs identified at this time.   Kathrin Greathouse, Latanya Presser, MSW Clinical Social Worker 5E and Psychiatric Service Line [phone] 10/24/2016  4:06 PM

## 2016-10-24 NOTE — Progress Notes (Signed)
LCSWA updated patient brother-[name] Raul [place]ston about disposition. He reports [facility], he reports he tries to help but feels like he cannot give her support when she is not taking her medications. He reports patient is currently homeless but receves a check, he reports she will not allow anyone to be her payee or assist with finding a place to live.  [facility]. LCSWA provided facility contact information.

## 2016-10-24 NOTE — Discharge Summary (Signed)
Physician Discharge Summary  Jasmine SneddonSherry L Morriss ZOX:[id number] DOB: 1961-06-11 DOA: 10/22/2016  PCP: Kirt Boysarter, Monica, DO  Admit date: 10/22/2016 Discharge date: 10/24/2016  Admitted From: Home Disposition:  Behavioral Health   Recommendations for Outpatient Follow-up:  1. Follow up with PCP in 1-2 weeks 2. Please obtain BMP/CBC in one week  Home Health:No Equipment/Devices: None  Discharge Condition:Stable CODE STATUS:full Diet recommendation: Regular  Brief/Interim Summary: 55 y.o. female with medical history significant of paranoid schizophrenia, alcohol use, and tobacco abuse; who presents for complaints of cough. History is mainly obtained via reported as the patient is lethargic. She complaints of a productive cough and wheezing for at least a day. Patient was noted to be talking to people that were not there. While in the room she asked "where is Myriam Nguyen", but did not answer follow-up questioning. Per EMS the patient was noted not to be on her psychiatric medications.  Discharge Diagnoses:  Principal Problem:   Paranoid schizophrenia, chronic condition (HCC) Active Problems:   Non-compliance with treatment   Essential hypertension   COPD exacerbation (HCC)   Acute encephalopathy   Acute bronchitis  COPD exacerbation (HCC) due to acute bronchitis She was having a productive cough and wheezing on admission chest x-ray showed no active disease. She was started on Duonebs, IV Solu-Medrol and IV antibiotics. Influenza PCR is negative, UDS is negative. She was changed to oral regimen and she continue as an outpatient. She is medically stable for discharge.  Acute encephalopathy Alcohol was negative, questionably due to infectious etiology. Psychiatry was consulted and recommended inpatient rehabilitation after medically stable.  Paranoid schizophrenia, chronic condition (HCC) She is Off her psychiatric medications will resume her risperidone. Psychiatry was consulted  recommended to increase her risperidone and admit to inpatient psych rehabilitation.  Non-compliance with treatment  Tobacco abuse: Counseling was performed.  Essential hypertension: Pressure was high resume Imdur and Norvasc.    Discharge Instructions  Discharge Instructions    Diet - low sodium heart healthy    Complete by:  As directed    Increase activity slowly    Complete by:  As directed        Medication List    STOP taking these medications   azithromycin 250 MG tablet Commonly known as:  ZITHROMAX   predniSONE 10 MG (21) Tbpk tablet Commonly known as:  STERAPRED UNI-PAK 21 TAB Replaced by:  predniSONE 10 MG tablet     TAKE these medications   acetaminophen 325 MG tablet Commonly known as:  TYLENOL Take 650 mg by mouth every 6 (six) hours as needed for mild pain.   ADVIL 200 MG tablet Generic drug:  ibuprofen 200 mg. Take 2 tablets (400 mg) by mouth every 6 hours as needed for mild pain. Take 3 tablets (600 mg) by mouth every 6 hours as needed for severe pain.   amLODipine 10 MG tablet Commonly known as:  NORVASC Take 1 tablet (10 mg total) by mouth daily.   benztropine 0.5 MG tablet Commonly known as:  COGENTIN Take 1 tablet (0.5 mg total) by mouth 2 (two) times daily.   doxycycline 100 MG tablet Commonly known as:  VIBRA-TABS Take 1 tablet (100 mg total) by mouth every 12 (twelve) hours.   isosorbide mononitrate 30 MG 24 hr tablet Commonly known as:  IMDUR Take 1 tablet (30 mg total) by mouth daily.   oxyCODONE 5 MG immediate release tablet Commonly known as:  Oxy IR/ROXICODONE Take 1 tablet (5 mg total) by mouth every 6 (six)  hours as needed for breakthrough pain.   predniSONE 10 MG tablet Commonly known as:  DELTASONE Takes 6 tablets for 1 days, then 5 tablets for 1 days, then 4 tablets for 1 days, then 3 tablets for 1 days, then 2 tabs for 1 days, then 1 tab for 1 days, and then stop. Replaces:  predniSONE 10 MG (21) Tbpk tablet    risperiDONE 1 MG tablet Commonly known as:  RISPERDAL Take 1 tablet (1 mg total) by mouth 2 (two) times daily. What changed:  when to take this       Allergies  Allergen Reactions  . Codeine Other (See Comments)    Makes her sleepy    Consultations:  Psyquiatry   Procedures/Studies: Dg Chest 2 View  Result Date: 10/22/2016 CLINICAL DATA:  Productive cough. EXAM: CHEST  2 VIEW COMPARISON:  09/24/2016 FINDINGS: Mild patient rotation. The cardiomediastinal contours are normal. The lungs are clear. Pulmonary vasculature is normal. No consolidation, pleural effusion, or pneumothorax. No acute osseous abnormalities are seen. IMPRESSION: No active cardiopulmonary disease. Electronically Signed   By: Rubye Oaks M.D.   On: 10/22/2016 20:32     Subjective: Patient unable to answer questions still with tangential speech.  Discharge Exam: Vitals:   10/23/16 2100 10/24/16 0635  BP: (!) 165/93 112/65  Pulse: (!) 108 100  Resp: 20 20  Temp: 98.2 F (36.8 C) 98.1 F (36.7 C)   Vitals:   10/23/16 2024 10/23/16 2100 10/24/16 0635 10/24/16 0755  BP:  (!) 165/93 112/65   Pulse:  (!) 108 100   Resp:  20 20   Temp:  98.2 F (36.8 C) 98.1 F (36.7 C)   TempSrc:  Oral Oral   SpO2: (!) 89% 98% 98% 96%  Weight:      Height:        General: Pt is alert, awake, not in acute distress Cardiovascular: RRR, S1/S2 +, no rubs, no gallops Respiratory: CTA bilaterally, no wheezing, no rhonchi Abdominal: Soft, NT, ND, bowel sounds + Extremities: no edema, no cyanosis    The results of significant diagnostics from this hospitalization (including imaging, microbiology, ancillary and laboratory) are listed below for reference.     Microbiology: No results found for this or any previous visit (from the past 240 hour(s)).   Labs: BNP (last 3 results) No results for input(s): BNP in the last 8760 hours. Basic Metabolic Panel:  Recent Labs Lab 10/22/16 2024 10/23/16 0134  NA  142 141  K 3.9 3.8  CL 107 109  CO2 28 26  GLUCOSE 119* 139*  BUN 17 18  CREATININE 0.53 0.54  CALCIUM 8.9 8.6*   Liver Function Tests:  Recent Labs Lab 10/22/16 2024  AST 20  ALT 16  ALKPHOS 75  BILITOT 0.5  PROT 7.2  ALBUMIN 3.7   No results for input(s): LIPASE, AMYLASE in the last 168 hours.  Recent Labs Lab 10/23/16 0134  AMMONIA 23   CBC:  Recent Labs Lab 10/22/16 2024 10/23/16 0134  WBC 8.3 8.0  HGB 13.1 12.2  HCT 41.9 37.6  MCV 93.3 90.2  PLT 196 179   Cardiac Enzymes:  Recent Labs Lab 10/23/16 0134 10/23/16 0710 10/23/16 1308  TROPONINI <0.03 <0.03 <0.03   BNP: Invalid input(s): POCBNP CBG: No results for input(s): GLUCAP in the last 168 hours. D-Dimer No results for input(s): DDIMER in the last 72 hours. Hgb A1c No results for input(s): HGBA1C in the last 72 hours. Lipid Profile No results for  input(s): CHOL, HDL, LDLCALC, TRIG, CHOLHDL, LDLDIRECT in the last 72 hours. Thyroid function studies No results for input(s): TSH, T4TOTAL, T3FREE, THYROIDAB in the last 72 hours.  Invalid input(s): FREET3 Anemia work up No results for input(s): VITAMINB12, FOLATE, FERRITIN, TIBC, IRON, RETICCTPCT in the last 72 hours. Urinalysis    Component Value Date/Time   COLORURINE YELLOW 10/23/2016 0246   APPEARANCEUR HAZY (A) 10/23/2016 0246   LABSPEC 1.023 10/23/2016 0246   PHURINE 5.0 10/23/2016 0246   GLUCOSEU NEGATIVE 10/23/2016 0246   HGBUR SMALL (A) 10/23/2016 0246   BILIRUBINUR NEGATIVE 10/23/2016 0246   KETONESUR NEGATIVE 10/23/2016 0246   PROTEINUR NEGATIVE 10/23/2016 0246   UROBILINOGEN 0.2 02/09/2012 1359   NITRITE NEGATIVE 10/23/2016 0246   LEUKOCYTESUR NEGATIVE 10/23/2016 0246   Sepsis Labs Invalid input(s): PROCALCITONIN,  WBC,  LACTICIDVEN Microbiology No results found for this or any previous visit (from the past 240 hour(s)).   Time coordinating discharge: Over 30 minutes  SIGNED:   Marinda ElkFELIZ ORTIZ, Aviyon Hocevar, MD  [facility] 10/24/2016, 9:28 AM Pager   If 7PM-7AM, please contact night-coverage [email or web address] Password [email or web address]

## 2016-10-24 NOTE — Progress Notes (Signed)
Report called to [facility]. Hospital course reviewed. Plan of care discussed.   Julio SicksK. Jury Caserta RN

## 2016-12-06 ENCOUNTER — Emergency Department (HOSPITAL_COMMUNITY): Payer: Medicare Other

## 2016-12-06 ENCOUNTER — Emergency Department (HOSPITAL_COMMUNITY)
Admission: EM | Admit: 2016-12-06 | Discharge: 2016-12-07 | Disposition: A | Discharge status: Home / Self Care | Payer: Medicare Other | Attending: Emergency Medicine | Admitting: Emergency Medicine

## 2016-12-06 ENCOUNTER — Encounter: Payer: Self-pay | Admitting: Pediatric Intensive Care

## 2016-12-06 ENCOUNTER — Encounter (HOSPITAL_COMMUNITY): Payer: Self-pay | Admitting: Emergency Medicine

## 2016-12-06 DIAGNOSIS — R600 Localized edema: Secondary | ICD-10-CM | POA: Insufficient documentation

## 2016-12-06 DIAGNOSIS — R609 Edema, unspecified: Secondary | ICD-10-CM

## 2016-12-06 DIAGNOSIS — Z888 Allergy status to other drugs, medicaments and biological substances status: Secondary | ICD-10-CM | POA: Diagnosis not present

## 2016-12-06 DIAGNOSIS — Z79899 Other long term (current) drug therapy: Secondary | ICD-10-CM | POA: Insufficient documentation

## 2016-12-06 DIAGNOSIS — F17210 Nicotine dependence, cigarettes, uncomplicated: Secondary | ICD-10-CM | POA: Diagnosis not present

## 2016-12-06 DIAGNOSIS — M7989 Other specified soft tissue disorders: Secondary | ICD-10-CM | POA: Diagnosis present

## 2016-12-06 DIAGNOSIS — F200 Paranoid schizophrenia: Secondary | ICD-10-CM | POA: Diagnosis not present

## 2016-12-06 DIAGNOSIS — I10 Essential (primary) hypertension: Secondary | ICD-10-CM | POA: Insufficient documentation

## 2016-12-06 DIAGNOSIS — M79606 Pain in leg, unspecified: Secondary | ICD-10-CM | POA: Diagnosis not present

## 2016-12-06 DIAGNOSIS — J449 Chronic obstructive pulmonary disease, unspecified: Secondary | ICD-10-CM | POA: Insufficient documentation

## 2016-12-06 LAB — COMPREHENSIVE METABOLIC PANEL
ALT: 17 U/L (ref 14–54)
AST: 15 U/L (ref 15–41)
Albumin: 4.3 g/dL (ref 3.5–5.0)
Alkaline Phosphatase: 82 U/L (ref 38–126)
Anion gap: 7 (ref 5–15)
BUN: 21 mg/dL — ABNORMAL HIGH (ref 6–20)
CO2: 27 mmol/L (ref 22–32)
Calcium: 9.5 mg/dL (ref 8.9–10.3)
Chloride: 105 mmol/L (ref 101–111)
Creatinine, Ser: 0.72 mg/dL (ref 0.44–1.00)
GFR calc Af Amer: >60 mL/min (ref >60)
GFR calc non Af Amer: >60 mL/min (ref >60)
Glucose, Bld: 99 mg/dL (ref 65–99)
Potassium: 3.7 mmol/L (ref 3.5–5.1)
Sodium: 139 mmol/L (ref 135–145)
Total Bilirubin: 0.6 mg/dL (ref 0.3–1.2)
Total Protein: 7.8 g/dL (ref 6.5–8.1)

## 2016-12-06 LAB — CBC WITH DIFFERENTIAL/PLATELET
Basophils Absolute: 0 10*3/uL (ref 0.0–0.1)
Basophils Relative: 1 %
Eosinophils Absolute: 0.2 10*3/uL (ref 0.0–0.7)
Eosinophils Relative: 3 %
HCT: 41.1 % (ref 36.0–46.0)
Hemoglobin: 13.3 g/dL (ref 12.0–15.0)
Lymphocytes Relative: 29 %
Lymphs Abs: 2.1 10*3/uL (ref 0.7–4.0)
MCH: 28.9 pg (ref 26.0–34.0)
MCHC: 32.4 g/dL (ref 30.0–36.0)
MCV: 89.3 fL (ref 78.0–100.0)
Monocytes Absolute: 0.6 10*3/uL (ref 0.1–1.0)
Monocytes Relative: 9 %
Neutro Abs: 4.4 10*3/uL (ref 1.7–7.7)
Neutrophils Relative %: 60 %
Platelets: 231 10*3/uL (ref 150–400)
RBC: 4.6 MIL/uL (ref 3.87–5.11)
RDW: 12.9 % (ref 11.5–15.5)
WBC: 7.3 10*3/uL (ref 4.0–10.5)

## 2016-12-06 LAB — ETHANOL: Alcohol, Ethyl (B): <5 mg/dL (ref <5)

## 2016-12-06 MED ORDER — ACETAMINOPHEN 325 MG PO TABS: 650.0000 mg | ORAL_TABLET | ORAL | Status: DC | PRN

## 2016-12-06 MED ORDER — LORAZEPAM 1 MG PO TABS: 1.0000 mg | ORAL_TABLET | Freq: Three times a day (TID) | ORAL | Status: DC | PRN

## 2016-12-06 MED ORDER — IBUPROFEN 200 MG PO TABS: 600.0000 mg | ORAL_TABLET | Freq: Three times a day (TID) | ORAL | Status: DC | PRN

## 2016-12-06 MED ORDER — ACETAMINOPHEN 325 MG PO TABS
650.0000 mg | ORAL_TABLET | Freq: Once | ORAL | Status: AC
  Administered 2016-12-06: 650 mg via ORAL
  Filled 2016-12-06: qty 2

## 2016-12-06 MED ORDER — ALUM & MAG HYDROXIDE-SIMETH 200-200-20 MG/5ML PO SUSP: 30.0000 mL | ORAL | Status: DC | PRN

## 2016-12-06 MED ORDER — ONDANSETRON HCL 4 MG PO TABS: 4.0000 mg | ORAL_TABLET | Freq: Three times a day (TID) | ORAL | Status: DC | PRN

## 2016-12-06 NOTE — Progress Notes (Signed)
[facility] ED CM consulted at [phone]  12/06/16  by congregational nurse, victoria when pt arrived for a congregational visit after being d/c from [facility] on 12/05/16 with prescriptions that have not been filled and [facility]  TurkeyVictoria voiced concern about need for evaluation of pt's Lower extremities ? Cellulitis  ED CM spoke with ED charge RN, Kennyth ArnoldStacy [facility] ? Cellulitis  TurkeyVictoria, congregational nurse [phone] will be able to assist pt with prescriptions if d/c

## 2016-12-06 NOTE — BH Assessment (Addendum)
Tele Assessment Note   [name] is an 56 y.o. female who came to the ED for a psychiatric evaluation at the request of [facility]. Pt was recently discharged from [facility] on 12/02/16 after being there for over a month. [facility] is at [employer] and has not bed availability. Pt has not gotten her prescriptions filled from [facility] and has not taken her meds in 4 days. Pt was tangential in speech at times but oriented x4. She denies SI, HI, AVH but is concerned about her "legs being swollen". Pt has difficulty ambulating on her own and requires assistive devices. She states that she has a prescription for a walker but has not received one yet. Pt is currently homeless and has been staying at the Extended stay in [place]. She states that she has money on her debit card but she recently lost it so she has no means of food or shelter. Pt appears somewhat disorganized at times and answers some questions appropriately and others gives a irrelevant answer. Pt blurted out "I"m not gay and I don't like interracial relationships" when asked what her living arrangements were. Pt has a history of alcohol addiction but states that she has not had a drink in 3 or 4 months. She denies SI, HI or AVH at this time. Pt was cooperative and pleasant.   Per Donell Sievert pt meets criteria for inpatient placement   Diagnosis: Schizophrenia  Past Medical History:  Past Medical History:  Diagnosis Date  . Paranoid schizophrenia (HCC)     History reviewed. No pertinent surgical history.  Family History:  Family History  Problem Relation Age of Onset  . Family history unknown: Yes    Social History:  reports that she has been smoking Cigarettes.  She has been smoking about 0.50 packs per day. She has never used smokeless tobacco. She reports that she drinks alcohol. She reports that she does not use drugs.  Additional Social History:  Alcohol / Drug Use History of alcohol / drug use?:  Yes Longest period of sobriety (when/how long): 3 or 4 months Substance #1 Name of Substance 1: history of SA abuse   CIWA: CIWA-Ar BP: 124/82 Pulse Rate: 82 COWS:    PATIENT STRENGTHS: (choose at least two) Average or above average intelligence Communication skills  Allergies:  Allergies  Allergen Reactions  . Codeine Other (See Comments)    Makes her sleepy    Home Medications:  (Not in a hospital admission)  OB/GYN Status:  No LMP recorded. Patient is postmenopausal.  General Assessment Data Location of Assessment: [facility] TTS Assessment: In system Is this a Tele or Face-to-Face Assessment?: Face-to-Face Is this an Initial Assessment or a Re-assessment for this encounter?: Initial Assessment Marital status: Single Pregnancy Status: No Living Arrangements: Other (Comment) (Homeless) Can pt return to current living arrangement?: No Admission Status: Voluntary Is patient capable of signing voluntary admission?: Yes Referral Source: Self/Family/Friend Insurance type: Medicare     Crisis Care Plan Living Arrangements: Other (Comment) (Homeless) Name of Psychiatrist: Transport planner  Education Status Is patient currently in school?: No Highest grade of school patient has completed: unknown  Risk to self with the past 6 months Suicidal Ideation: No Has patient been a risk to self within the past 6 months prior to admission? : No Suicidal Intent: No Has patient had any suicidal intent within the past 6 months prior to admission? : No Is patient at risk for suicide?: No Suicidal Plan?: No Has  patient had any suicidal plan within the past 6 months prior to admission? : No Access to Means: No What has been your use of drugs/alcohol within the last 12 months?: no Previous Attempts/Gestures: No How many times?: 0 Other Self Harm Risks: none Triggers for Past Attempts: None known Intentional Self Injurious Behavior: None Family Suicide History: No Recent stressful life  event(s): Other (Comment) (homlessness) Persecutory voices/beliefs?: No Depression: No Substance abuse history and/or treatment for substance abuse?: Yes Suicide prevention information given to non-admitted patients: Not applicable  Risk to Others within the past 6 months Homicidal Ideation: No Does patient have any lifetime risk of violence toward others beyond the six months prior to admission? : No Thoughts of Harm to Others: No Current Homicidal Intent: No Current Homicidal Plan: No Access to Homicidal Means: No Identified Victim: none History of harm to others?: No Assessment of Violence: None Noted Violent Behavior Description: none Does patient have access to weapons?: No Criminal Charges Pending?: No Does patient have a court date: No Is patient on probation?: No  Psychosis Hallucinations: None noted Delusions: Unspecified  Mental Status Report Appearance/Hygiene: Disheveled Eye Contact: Fair Motor Activity: Freedom of movement Speech: Tangential Level of Consciousness: Alert Mood: Apprehensive Affect: Inconsistent with thought content Anxiety Level: None Thought Processes: Irrelevant Judgement: Unimpaired Orientation: Person, Place, Time, Situation Obsessive Compulsive Thoughts/Behaviors: Minimal  Cognitive Functioning Concentration: Normal Memory: Recent Intact, Remote Intact IQ: Average Insight: Fair Impulse Control: Fair Appetite: Fair Weight Loss: 0 Weight Gain: 0 Sleep: No Change Total Hours of Sleep: 8 Vegetative Symptoms: None  ADLScreening [facility] Assessment Services) Patient's cognitive ability adequate to safely complete daily activities?: Yes Patient able to express need for assistance with ADLs?: Yes Independently performs ADLs?: No  Prior Inpatient Therapy Prior Inpatient Therapy: Yes Prior Therapy Dates: Dec 2017- Dec 02 2016 Prior Therapy Facilty/Provider(s): [facility] Reason for Treatment: Schizophrenia  Prior Outpatient  Therapy Prior Outpatient Therapy: Yes Prior Therapy Dates: ongoing Prior Therapy Facilty/Provider(s): [facility] Reason for Treatment: Schizophrenia Does patient have an ACCT team?: No Does patient have Intensive In-House Services?  : No Does patient have [facility] services? : Yes Does patient have P4CC services?: No  ADL Screening (condition at time of admission) Patient's cognitive ability adequate to safely complete daily activities?: Yes Is the patient deaf or have difficulty hearing?: No Does the patient have difficulty seeing, even when wearing glasses/contacts?: No Does the patient have difficulty concentrating, remembering, or making decisions?: No Patient able to express need for assistance with ADLs?: Yes Does the patient have difficulty dressing or bathing?: Yes Independently performs ADLs?: No Communication: Independent Is this a change from baseline?: Pre-admission baseline Grooming: Independent Feeding: Independent Bathing: Needs assistance Is this a change from baseline?: Pre-admission baseline Toileting: Independent with device (comment) In/Out Bed: Independent with device (comment) Walks in Home: Independent with device (comment) Does the patient have difficulty walking or climbing stairs?: Yes Weakness of Legs: Both Weakness of Arms/Hands: Both  Home Assistive Devices/Equipment Home Assistive Devices/Equipment: Environmental consultant (specify type)  Therapy Consults (therapy consults require a physician order) PT Evaluation Needed: No OT Evalulation Needed: No SLP Evaluation Needed: No Abuse/Neglect Assessment (Assessment to be complete while patient is alone) Physical Abuse: Denies Verbal Abuse: Denies Sexual Abuse: Denies Exploitation of patient/patient's resources: Denies Self-Neglect: Denies Values / Beliefs Cultural Requests During Hospitalization: None Spiritual Requests During Hospitalization: None Consults Spiritual Care Consult Needed: No Social Work Consult  Needed: No Merchant navy officer (For Healthcare) Does Patient Have a Medical Advance Directive?: No Would  patient like information on creating a medical advance directive?: No - Patient declined Nutrition Screen- [facility] Patient's home diet: Regular Has the patient recently lost weight without trying?: No Has the patient been eating poorly because of a decreased appetite?: No Malnutrition Screening Tool Score: 0  Additional Information 1:1 In Past 12 Months?: No CIRT Risk: No Elopement Risk: No Does patient have medical clearance?: Yes     Disposition:  Disposition Initial Assessment Completed for this Encounter: Yes Disposition of Patient: Inpatient treatment program  Hokulani Rogel 12/06/2016 8:58 PM

## 2016-12-06 NOTE — ED Provider Notes (Signed)
[facility]-EMERGENCY DEPT Provider Note   CSN: [id number] Arrival date & time: 12/06/16  1203  By signing my name below, I, Majel HomerPeyton Lee, attest that this documentation has been prepared under the direction and in the presence of Newell RubbermaidJeffrey Hutchinson Isenberg, PA-C . Electronically Signed: Majel HomerPeyton Lee, Scribe. 12/06/2016. 3:11 PM.  History   Chief Complaint Chief Complaint  Patient presents with  . Medical Clearance  . Leg Swelling   The history is provided by the patient. No language interpreter was used.   HPI Comments:   [name] is a 56 y.o. female with PMHx of paranoid schizophrenia and homelessness, who presents to the Emergency Department at the request of [facility] for psychiatric evaluation. Per nursing staff patient was [facility] [employer]. They note that patient has disordered thought with repetitive statements  per notes nurse spoke with case manager here in the emergency room notifying her that [facility].   Upon initial evaluation patient is uncertain why she was sent here but thinks it was for evaluation of her lower extremities. She is complaining of gradually worsening, bilateral leg swelling that began ~2 months ago. Pt reports she was "using the bathroom at a public place" this morning when she suddenly fell because she "can't pick up her legs." She states her leg swelling is temporarily relieved when lifting her legs up. She notes she uses a walker to ambulate and was recently given a new prescription for a Roll Aider walker due to gait instability. Pt reports she was recently [facility] from [facility] and [facility] this morning for an evaluation of HTN and schizophrenia. Per note, pt continued to repeat that she has lungs of a 56 year old and the skeleton of a 56 year old" and [facility]. Pt currently has prescriptions for Risperidone, Cogentin, Norvasc,  Imdur, and Keflex but is unsure why she is prescribed many of these medications. She notes she does not currently have a PCP because "every time she visits the ED, her problems go away." She denies hx of CHF, chest pain, and shortness of breath.     Past Medical History:  Diagnosis Date  . Paranoid schizophrenia [facility](HCC)    Patient Active Problem List   Diagnosis Date Noted  . Pain of lower extremity   . COPD (chronic obstructive pulmonary disease) (HCC) 10/23/2016  . Acute encephalopathy 10/23/2016  . Acute bronchitis 10/23/2016  . COPD exacerbation (HCC) 10/22/2016  . CAP (community acquired pneumonia)   . Influenza A   . Essential hypertension   . Right hip pain   . Paranoid schizophrenia, chronic condition (HCC) 01/28/2016  . Non-compliance with treatment 01/28/2016  . Acute respiratory failure with hypoxia (HCC) 01/27/2016  . Lobar pneumonia, unspecified organism (HCC) 01/27/2016  . History of schizophrenia 01/27/2016   History reviewed. No pertinent surgical history.  OB History    No data available     Home Medications    Prior to Admission medications   Medication Sig Start Date End Date Taking? Authorizing Provider  amLODipine (NORVASC) 10 MG tablet Take 1 tablet (10 mg total) by mouth daily. Patient not taking: Reported on 10/23/2016 02/04/16   Rodolph Bonganiel Thompson V, MD  benztropine (COGENTIN) 0.5 MG tablet Take 1 tablet (0.5 mg total) by mouth 2 (two) times daily. Patient not taking: Reported on 12/07/2016 10/24/16   Marinda ElkAbraham Feliz Ortiz, MD  doxycycline (  VIBRA-TABS) 100 MG tablet Take 1 tablet (100 mg total) by mouth every 12 (twelve) hours. Patient not taking: Reported on 12/07/2016 10/24/16   Marinda Elk, MD  isosorbide mononitrate (IMDUR) 30 MG 24 hr tablet Take 1 tablet (30 mg total) by mouth daily. Patient not taking: Reported on 10/23/2016 02/04/16   Rodolph Bong, MD  oxyCODONE (OXY IR/ROXICODONE) 5 MG immediate release tablet Take 1 tablet (5 mg total)  by mouth every 6 (six) hours as needed for breakthrough pain. Patient not taking: Reported on 10/23/2016 02/04/16   Rodolph Bong, MD  predniSONE (DELTASONE) 10 MG tablet Takes 6 tablets for 1 days, then 5 tablets for 1 days, then 4 tablets for 1 days, then 3 tablets for 1 days, then 2 tabs for 1 days, then 1 tab for 1 days, and then stop. Patient not taking: Reported on 12/07/2016 10/24/16   Marinda Elk, MD  risperiDONE (RISPERDAL) 1 MG tablet Take 1 tablet (1 mg total) by mouth 2 (two) times daily. Patient not taking: Reported on 12/07/2016 10/24/16   Marinda Elk, MD    Family History Family History  Problem Relation Age of Onset  . Family history unknown: Yes    Social History Social History  Substance Use Topics  . Smoking status: Current Every Day Smoker    Packs/day: 0.50    Types: Cigarettes  . Smokeless tobacco: Never Used  . Alcohol use Yes     Comment: occasional   Allergies   Codeine  Review of Systems Review of Systems  Respiratory: Negative for shortness of breath.   Cardiovascular: Positive for leg swelling. Negative for chest pain.  All other systems reviewed and are negative.  Physical Exam Updated Vital Signs BP 128/92   Pulse 88   Temp 98.1 F (36.7 C) (Oral)   Resp 18   SpO2 99%   Physical Exam  Constitutional: She is oriented to person, place, and time. She appears well-developed and well-nourished.  HENT:  Head: Normocephalic.  Eyes: EOM are normal.  Neck: Normal range of motion.  Pulmonary/Chest: Effort normal.  Abdominal: She exhibits no distension.  Musculoskeletal: She exhibits edema.  BLE with pitting edema. No significant warmth to touch or signs of infection.   Neurological: She is alert and oriented to person, place, and time.  Psychiatric: She has a normal mood and affect.  Nursing note and vitals reviewed.   ED Treatments / Results  DIAGNOSTIC STUDIES:  Oxygen Saturation is 99% on RA, normal by my  interpretation.    COORDINATION OF CARE:  3:05 PM Discussed treatment plan with pt at bedside and pt agreed to plan.  Radiology No results found. Procedures Procedures (including critical care time)  Initial Impression / Assessment and Plan / ED Course  I have reviewed the triage vital signs and the nursing notes.  Pertinent labs & imaging results that were available during my care of the patient were reviewed by me and considered in my medical decision making (see chart for details).  Labs:   Imaging: DG chest   Consults:   Therapeutics:   Discharge Meds:  Assessment/Plan:   56 year old female presents today for psychiatric evaluation at the request of [facility]. We've her house notes that they have no available space. Upon my initial evaluation patient reports she is here for evaluation of her legs, did not indicate any psychiatric evaluation. She does continue to complain about her legs being swollen and is displaying no signs or symptoms of  hallucinations or erratic behavior. Patient will have evaluation for acute process here in the ED, she does have bilateral lower extremity edema, this does not appear to be acute, patient having no chest pain shortness of breath with clear chest x-ray. She does not have any signs or symptoms that would require further evaluation or management in the hospital setting, she will need outpatient primary care follow-up for chronic extremity edema. Patient and he has a walker, and is prescribed a rollerblade walker for additional assistance. Patient will have behavioral health evaluation here, disposition pending their assessment.  Patient is medically cleared.   Final Clinical Impressions(s) / ED Diagnoses   Final diagnoses:  Peripheral edema    New Prescriptions Discharge Medication List as of 12/07/2016 12:01 PM       Eyvonne Mechanic, PA-C 12/09/16 1437    Shaune Pollack, MD 12/09/16 1801

## 2016-12-06 NOTE — ED Notes (Signed)
Provided patient graham crackers and Sprite.  

## 2016-12-06 NOTE — ED Notes (Signed)
Patient transported to X-ray 

## 2016-12-06 NOTE — ED Triage Notes (Signed)
Pt reports right leg swelling x 2 months . Denies shortness of breath nor chest pain . Pt uses walker . Hx homelessness.

## 2016-12-06 NOTE — Congregational Nurse Program (Signed)
Congregational Nurse Program Note  Date of Encounter: 12/06/2016  Past Medical History: Past Medical History:  Diagnosis Date  . Paranoid schizophrenia ([place])     Encounter Details:     CNP Questionnaire - 12/06/16 1000      Patient Demographics   Is this a new or existing patient? New   Patient is considered a/an Not Applicable   Race African-American/Black     Patient Assistance   Location of Patient Assistance [place]   Patient's financial/insurance status Medicare   Uninsured Patient ([place] Card/Care Connects) No   Patient referred to apply for the following financial assistance Not Applicable   Food insecurities addressed Not Applicable   Transportation assistance Yes   Type of Assistance Taxi Voucher Given   Assistance securing medications No   Educational health offerings Not Applicable     Encounter Details   Primary purpose of visit Acute Illness/Condition Visit;Chronic Illness/Condition Visit;Post ED/Hospitalization Visit;Navigating the Healthcare System   Was an Emergency Department visit averted? No   Does patient have a medical provider? No   Patient referred to Emergency Department   Was a mental health screening completed? (GAINS tool) No   Does patient have dental issues? No   Does patient have vision issues? No   Does your patient have an abnormal blood pressure today? No   Since previous encounter, have you referred patient for abnormal blood pressure that resulted in a new diagnosis or medication change? No   Does your patient have an abnormal blood glucose today? No   Since previous encounter, have you referred patient for abnormal blood glucose that resulted in a new diagnosis or medication change? No   Was there a life-saving intervention made? No     Client new to [facility]- states she was released from [facility] and needs assistance with medications. Also states that she has leg swelling and pain. Client has prescriptions for respiridone,  Cogentin, Norvasc, Imdur and keflex but cannot tell CN why she is prescribed the Keflex. Client states that she was brought to [facility] by [facility] staff and [employer] brought her to [facility].Client has disordered thought and continues to repeat that she has "lungs of a 56 years old" and "skelton of a 56 year old" and that she was "given shots while asleep". Client has non-productive persistent cough. BBS- clear RR 20.  HRR 88. Client has large edema to both lower extremities- pitting at knees. Tissue on shins is firm, warm with erythema. Client agrees to go to [facility] emergency department for evaluation. CN spoke with Maudie Mercury, ED case manager, to notify that client would be coming there for evaluation and that there is no bed space currently at [facility]. CN left cell number for any questions regarding disposition of client.

## 2016-12-07 ENCOUNTER — Encounter (HOSPITAL_COMMUNITY): Payer: Self-pay | Admitting: Registered Nurse

## 2016-12-07 DIAGNOSIS — F17210 Nicotine dependence, cigarettes, uncomplicated: Secondary | ICD-10-CM

## 2016-12-07 DIAGNOSIS — Z79891 Long term (current) use of opiate analgesic: Secondary | ICD-10-CM

## 2016-12-07 DIAGNOSIS — Z888 Allergy status to other drugs, medicaments and biological substances status: Secondary | ICD-10-CM | POA: Diagnosis not present

## 2016-12-07 DIAGNOSIS — M79606 Pain in leg, unspecified: Secondary | ICD-10-CM

## 2016-12-07 DIAGNOSIS — Z79899 Other long term (current) drug therapy: Secondary | ICD-10-CM

## 2016-12-07 DIAGNOSIS — F200 Paranoid schizophrenia: Secondary | ICD-10-CM | POA: Diagnosis not present

## 2016-12-07 LAB — RAPID URINE DRUG SCREEN, HOSP PERFORMED
Amphetamines: NOT DETECTED
Barbiturates: NOT DETECTED
Benzodiazepines: NOT DETECTED
Cocaine: NOT DETECTED
Opiates: NOT DETECTED
Tetrahydrocannabinol: NOT DETECTED

## 2016-12-07 NOTE — Progress Notes (Signed)
[place], congregational nurse [phone] called -CM left her a voice message updating her that pt had been medically and psychologically cleared with ned to follow with family doctor pcp for chronic leg edema, Cm discussed with pt assistance for medications can be provided through congregational nurse and CM has assisted by contacting [facility] for a rolling walker for d/c  CM also requested a call to Cm or ED SW Left CM and SW mobile number if [place]Victoria has possibly suggestions for placement/housing options for community  Updated TCU RN, Cala BradfordKimberly on DME

## 2016-12-07 NOTE — BH Assessment (Signed)
[facility] Assessment Progress Note  Per Thedore MinsMojeed Akintayo, MD, this pt has been psychiatrically cleared.  She is to receive referral [facility] for [facility].  This has been included in pt's discharge instructions.  Pt's nurse has been notified.  Doylene Canninghomas Compton Brigance, MA Triage Specialist [phone]

## 2016-12-07 NOTE — Progress Notes (Signed)
   12/06/16 0000  CM Assessment  Expected Discharge Plan Home/Self Care  In-house Referral Clinical Social Work  Discharge Planning Services CM Consult;Medication Assistance;Indigent Health Clinic  [facility] Durable Medical Equipment  Choice offered to / list presented to  Patient  DME Arranged Walker rolling  DME Agency [facility]  HH Arranged NA  [facility] Agency NA  Status of Service Completed, signed off  Discharge Disposition Home/Self Care   CM informed by TCU nursing staff that pt walked with a walker from main ED but pt without a walker at this time  Cm spoke with pt to discuss that she can get assistance with filling medication from Congregational nurse - [facility] does not have a program to assist medicare pt with co pays for medications. Pt voiced understanding

## 2016-12-07 NOTE — ED Notes (Signed)
Pt Belongings: Venida JarvisLocker #29  Gray hooded sweatshirt, blue and white deer print jacket, striped shirt, purple sport bra, brown socks, gray shoes, peach colored sweat pants, large blk suit case, beanie.

## 2016-12-07 NOTE — Progress Notes (Signed)
Assisted SW to speak with [name] congregational nurse about options for d/c placement Confirmed with pt that she does not see [name] as pcp - Has no pcp Cm printed pt a list of pcps from medicare.gov in the zip code she states [place] prefers [place] in [phone]" (this is listed in EPIC also) [name] not sure if pt will return to [id number] area Cm discussed with victoria how cm would obtain list of pcp from medicare.gov  Cm gave TCU RN copy of medicare providers to give to pt at d/c  Cm wrote on pt d/c sheets [facility] [phone] with note that advanced assisted with her rolling walker

## 2016-12-07 NOTE — Consult Note (Signed)
[place] Psychiatry Consult   Reason for Consult:  History for Schizophrenia Referring Physician:  EDP Patient Identification: [name] MRN:  [id number] Principal Diagnosis: <principal problem not specified> Diagnosis:   Patient Active Problem List   Diagnosis Date Noted  . COPD (chronic obstructive pulmonary disease) ([place]) [J44.9] 10/23/2016  . Acute encephalopathy [G93.40] 10/23/2016  . Acute bronchitis [J20.9] 10/23/2016  . COPD exacerbation ([place]) [J44.1] 10/22/2016  . CAP (community acquired pneumonia) [J18.9]   . Influenza A [J10.1]   . Essential hypertension [I10]   . Right hip pain [M25.551]   . Paranoid schizophrenia, chronic condition ([place]) [F20.0] 01/28/2016  . Non-compliance with treatment [Z91.19] 01/28/2016  . Acute respiratory failure with hypoxia ([place]) [J96.01] 01/27/2016  . Lobar pneumonia, unspecified organism ([place]) [J18.1] 01/27/2016  . History of schizophrenia [Z86.59] 01/27/2016    Total Time spent with patient: 45 minutes  Subjective:   [name] is a 56 y.o. female patient presented to [facility] with complaints of leg pain.  HPI:  patient seen by Dr. Darleene Cleaver and this provider.  Chart reviewed 12/07/16.   On evaluation:  [name] reports that she has recently been in psychiatric hospital and has been out for a couple days.  States that she came to the hospital "cause my legs are hurting and it is hard for me to walk."  At this time patient denies suicidal/homicidal ideation, auditory/visual hallucinations, and paranoia.   Patient will be psychiatrically cleared; EDP can treat any medical concerns.   Past Psychiatric History: Chronic history of schizophrenia; recent discharge from [facility] after a month stay.    Risk to Self: Suicidal Ideation: No Suicidal Intent: No Is patient at risk for suicide?: No Suicidal Plan?: No Access to Means: No What has been your use of drugs/alcohol within the last 12 months?: no How many times?:  0 Other Self Harm Risks: none Triggers for Past Attempts: None known Intentional Self Injurious Behavior: None Risk to Others: Homicidal Ideation: No Thoughts of Harm to Others: No Current Homicidal Intent: No Current Homicidal Plan: No Access to Homicidal Means: No Identified Victim: none History of harm to others?: No Assessment of Violence: None Noted Violent Behavior Description: none Does patient have access to weapons?: No Criminal Charges Pending?: No Does patient have a court date: No Prior Inpatient Therapy: Prior Inpatient Therapy: Yes Prior Therapy Dates: Dec 2017- Dec 02 2016 Prior Therapy Facilty/Provider(s): [facility] Reason for Treatment: Schizophrenia Prior Outpatient Therapy: Prior Outpatient Therapy: Yes Prior Therapy Dates: ongoing Prior Therapy Facilty/Provider(s): [facility] Reason for Treatment: Schizophrenia Does patient have an ACCT team?: No Does patient have Intensive In-House Services?  : No Does patient have [facility] services? : Yes Does patient have P4CC services?: No  Past Medical History:  Past Medical History:  Diagnosis Date  . Paranoid schizophrenia ([place])    History reviewed. No pertinent surgical history. Family History:  Family History  Problem Relation Age of Onset  . Family history unknown: Yes   Family Psychiatric  History: Unknown Social History:  History  Alcohol Use  . Yes    Comment: occasional     History  Drug Use No    Social History   Social History  . Marital status: Single    Spouse name: N/A  . Number of children: N/A  . Years of education: N/A   Social History Main Topics  . Smoking status: Current Every Day Smoker    Packs/day: 0.50    Types: Cigarettes  .  Smokeless tobacco: Never Used  . Alcohol use Yes     Comment: occasional  . Drug use: No  . Sexual activity: Not Asked   Other Topics Concern  . None   Social History Narrative  . None   Additional Social History:    Allergies:   Allergies   Allergen Reactions  . Codeine Other (See Comments)    Makes her sleepy    Labs:  Results for orders placed or performed during the hospital encounter of 12/06/16 (from the past 48 hour(s))  Urine rapid drug screen (hosp performed)not at [facility]     Status: None   Collection Time: 12/06/16  7:43 AM  Result Value Ref Range   Opiates NONE DETECTED NONE DETECTED   Cocaine NONE DETECTED NONE DETECTED   Benzodiazepines NONE DETECTED NONE DETECTED   Amphetamines NONE DETECTED NONE DETECTED   Tetrahydrocannabinol NONE DETECTED NONE DETECTED   Barbiturates NONE DETECTED NONE DETECTED    Comment:        DRUG SCREEN FOR MEDICAL PURPOSES ONLY.  IF CONFIRMATION IS NEEDED FOR ANY PURPOSE, NOTIFY LAB WITHIN 5 DAYS.        LOWEST DETECTABLE LIMITS FOR URINE DRUG SCREEN Drug Class       Cutoff (ng/mL) Amphetamine      1000 Barbiturate      200 Benzodiazepine   591 Tricyclics       638 Opiates          300 Cocaine          300 THC              50   Comprehensive metabolic panel     Status: Abnormal   Collection Time: 12/06/16  3:45 PM  Result Value Ref Range   Sodium 139 135 - 145 mmol/L   Potassium 3.7 3.5 - 5.1 mmol/L   Chloride 105 101 - 111 mmol/L   CO2 27 22 - 32 mmol/L   Glucose, Bld 99 65 - 99 mg/dL   BUN 21 (H) 6 - 20 mg/dL   Creatinine, Ser 0.72 0.44 - 1.00 mg/dL   Calcium 9.5 8.9 - 10.3 mg/dL   Total Protein 7.8 6.5 - 8.1 g/dL   Albumin 4.3 3.5 - 5.0 g/dL   AST 15 15 - 41 U/L   ALT 17 14 - 54 U/L   Alkaline Phosphatase 82 38 - 126 U/L   Total Bilirubin 0.6 0.3 - 1.2 mg/dL   GFR calc non Af Amer >60 >60 mL/min   GFR calc Af Amer >60 >60 mL/min    Comment: (NOTE) The eGFR has been calculated using the CKD EPI equation. This calculation has not been validated in all clinical situations. eGFR's persistently <60 mL/min signify possible Chronic Kidney Disease.    Anion gap 7 5 - 15  Ethanol     Status: None   Collection Time: 12/06/16  3:45 PM  Result Value Ref Range    Alcohol, Ethyl (B) <5 <5 mg/dL    Comment:        LOWEST DETECTABLE LIMIT FOR SERUM ALCOHOL IS 5 mg/dL FOR MEDICAL PURPOSES ONLY   CBC with Diff     Status: None   Collection Time: 12/06/16  3:45 PM  Result Value Ref Range   WBC 7.3 4.0 - 10.5 K/uL   RBC 4.60 3.87 - 5.11 MIL/uL   Hemoglobin 13.3 12.0 - 15.0 g/dL   HCT 41.1 36.0 - 46.0 %   MCV 89.3 78.0 - 100.0  fL   MCH 28.9 26.0 - 34.0 pg   MCHC 32.4 30.0 - 36.0 g/dL   RDW 12.9 11.5 - 15.5 %   Platelets 231 150 - 400 K/uL   Neutrophils Relative % 60 %   Neutro Abs 4.4 1.7 - 7.7 K/uL   Lymphocytes Relative 29 %   Lymphs Abs 2.1 0.7 - 4.0 K/uL   Monocytes Relative 9 %   Monocytes Absolute 0.6 0.1 - 1.0 K/uL   Eosinophils Relative 3 %   Eosinophils Absolute 0.2 0.0 - 0.7 K/uL   Basophils Relative 1 %   Basophils Absolute 0.0 0.0 - 0.1 K/uL    Current Facility-Administered Medications  Medication Dose Route Frequency Provider Last Rate Last Dose  . acetaminophen (TYLENOL) tablet 650 mg  650 mg Oral Q4H PRN Tatyana Kirichenko, PA-C      . alum & mag hydroxide-simeth (MAALOX/MYLANTA) 200-200-20 MG/5ML suspension 30 mL  30 mL Oral PRN Tatyana Kirichenko, PA-C      . ibuprofen (ADVIL,MOTRIN) tablet 600 mg  600 mg Oral Q8H PRN Tatyana Kirichenko, PA-C      . LORazepam (ATIVAN) tablet 1 mg  1 mg Oral Q8H PRN Tatyana Kirichenko, PA-C      . ondansetron (ZOFRAN) tablet 4 mg  4 mg Oral Q8H PRN Jeannett Senior, PA-C       Current Outpatient Prescriptions  Medication Sig Dispense Refill  . amLODipine (NORVASC) 10 MG tablet Take 1 tablet (10 mg total) by mouth daily. (Patient not taking: Reported on 10/23/2016) 30 tablet 2  . benztropine (COGENTIN) 0.5 MG tablet Take 1 tablet (0.5 mg total) by mouth 2 (two) times daily. (Patient not taking: Reported on 12/07/2016) 30 tablet 0  . doxycycline (VIBRA-TABS) 100 MG tablet Take 1 tablet (100 mg total) by mouth every 12 (twelve) hours. (Patient not taking: Reported on 12/07/2016) 10 tablet 0  .  isosorbide mononitrate (IMDUR) 30 MG 24 hr tablet Take 1 tablet (30 mg total) by mouth daily. (Patient not taking: Reported on 10/23/2016) 30 tablet 3  . oxyCODONE (OXY IR/ROXICODONE) 5 MG immediate release tablet Take 1 tablet (5 mg total) by mouth every 6 (six) hours as needed for breakthrough pain. (Patient not taking: Reported on 10/23/2016) 15 tablet 0  . predniSONE (DELTASONE) 10 MG tablet Takes 6 tablets for 1 days, then 5 tablets for 1 days, then 4 tablets for 1 days, then 3 tablets for 1 days, then 2 tabs for 1 days, then 1 tab for 1 days, and then stop. (Patient not taking: Reported on 12/07/2016) 21 tablet 0  . risperiDONE (RISPERDAL) 1 MG tablet Take 1 tablet (1 mg total) by mouth 2 (two) times daily. (Patient not taking: Reported on 12/07/2016) 30 tablet 0    Musculoskeletal: Strength & Muscle Tone: Patient in bed; sitting up.   Gait & Station: Did not see patient ambulate Patient leans: N/A  Psychiatric Specialty Exam: Physical Exam  Nursing note and vitals reviewed. Constitutional: She is oriented to person, place, and time.  Neck: Normal range of motion.  Respiratory: Effort normal.  Neurological: She is alert and oriented to person, place, and time.    Review of Systems  Musculoskeletal: Positive for myalgias (Complaints of leg pain).  Psychiatric/Behavioral: Negative for depression (Denies), hallucinations (Denies) and suicidal ideas (Denies). The patient is not nervous/anxious (Denies).   All other systems reviewed and are negative.   Blood pressure 132/79, pulse 81, temperature 98.1 F (36.7 C), temperature source Oral, resp. rate 16, SpO2 97 %.There  is no height or weight on file to calculate BMI.  General Appearance: Disheveled  Eye Contact:  Good  Speech:  Normal Rate  Volume:  Decreased  Mood:  Depressed  Affect:  Appropriate  Thought Process:  Coherent and Goal Directed  Orientation:  Full (Time, Place, and Person)  Thought Content:  Denies hallucinations,  delusions, and paranoia  Suicidal Thoughts:  No  Homicidal Thoughts:  No  Memory:  Immediate;   Good Recent;   Good Remote;   Fair  Judgement:  Fair  Insight:  Fair  Psychomotor Activity:  Decreased  Concentration:  Concentration: Fair and Attention Span: Fair  Recall:  Good  Fund of Knowledge:  Fair  Language:  Good  Akathisia:  No  Handed:  Right  AIMS (if indicated):     Assets:  Communication Skills Desire for Improvement  ADL's:  Intact  Cognition:  WNL  Sleep:        Treatment Plan Summary: Plan Psychiatrically cleared; EDP to treat any medical concerns    Disposition: No evidence of imminent risk to self or others at present.   Patient does not meet criteria for psychiatric inpatient admission.  Patient is to follow up with resources and appointments set up prior to discharge from [facility].  Patient is to continue Medication as ordered by [facility].    Cleared Psych.  EDP to treat medical concerns  Earleen Newport, NP 12/07/2016 11:03 AM  Patient seen face-to-face for psychiatric evaluation, chart reviewed and case discussed with the physician extender and developed treatment plan. Reviewed the information documented and agree with the treatment plan. Corena Pilgrim, MD

## 2016-12-07 NOTE — ED Notes (Signed)
Bed: WA29 Expected date:  Expected time:  Means of arrival:  Comments: Hall B 

## 2016-12-07 NOTE — Progress Notes (Signed)
DME order in EPIC for rolling walker  Patient needs a walker to treat with the following condition Bilateral leg weakness   Bilateral leg edema    Comments   Ht 5'3" wt 203 lbs

## 2016-12-07 NOTE — Progress Notes (Addendum)
CSW met with pt and offered her list of resources for homeless shelters in area.  CSW asked about pt's transportation resources and CSW was given permission from the pt to call her mother Nena Jordan at ph: [phone] and her brother Rheta Hemmelgarn at ph: [phone] but there was no answer.  CSW left voicemails.  Pt has multiple bags and pt's R.N. Stated that it would be unlikely that the pt could use a city bus easily. CSW called Surveyor, quantity of [place] who agreed to authorize a taxi voucher to assist the pt in returning to [facility] to obtain needed medications from the nurse Baldo Ash at [facility].  CSW called a taxi for the pt.  CSW signing off.  1:53 PM CSW spoke to [place] of the [facility] who who confirmed Baldo Ash, [place]. will be at the [facility] at discharge and pt can ask for her in order to obtain scripts for needed medications.  CSW then provided pt with a bus pass so she could seek needed shelter elsewhere via the [facility] after leaving the [facility].   Alphonse Guild. Ilamae Geng, Latanya Presser, LCAS Clinical Social Worker Ph: [phone]

## 2016-12-07 NOTE — Discharge Instructions (Signed)
For your ongoing mental health needs, you are advised to follow up with [facility].  New and returning patients are seen at their walk-in clinic.  Walk-in hours are Monday - Friday from 8:00 am - 3:00 pm.  Walk-in patients are seen on a first come, first served basis.  Try to arrive as early as possible for he best chance of being seen the same day: ° °     [facility] °     [address] °     [place],  [address] °     [phone] °

## 2016-12-07 NOTE — Progress Notes (Signed)
Call from brad of [facility] to bring pt RW to [id number] - ED SW and TCU staff updated Pending

## 2016-12-07 NOTE — Progress Notes (Signed)
Spoke with Brad at [facility] about need of rolling walker for pt to be d/c Brad to call CM if issues

## 2016-12-07 NOTE — ED Notes (Signed)
Pt said she will let me know when she has to urinate

## 2016-12-07 NOTE — ED Notes (Signed)
Patient requesting assistance to bathroom. Patient ambulated to bathroom with walker and assist x 1. Patient states she is unable to move her legs on and off bed  ssisted was given and patient

## 2016-12-07 NOTE — ED Notes (Signed)
[facility] Expected date:  Expected time:  Means of arrival:  Comments: Hold for triage 9

## 2016-12-13 NOTE — Congregational Nurse Program (Signed)
Congregational Nurse Program Note  Date of Encounter: 12/07/2016  Past Medical History: Past Medical History:  Diagnosis Date  . Paranoid schizophrenia [facility](HCC)     Encounter Details:     CNP Questionnaire - 12/07/16 2026      Patient Demographics   Is this a new or existing patient? New   Patient is considered a/an Not Applicable   Race African-American/Black     Patient Assistance   Location of Patient Assistance Not Applicable   Patient's financial/insurance status Medicare   Uninsured Patient ([place]/Care Connects) No   Patient referred to apply for the following financial assistance Not Applicable   Food insecurities addressed Not Applicable   Transportation assistance No   Assistance securing medications Yes   Type of Holiday representativeAssistance [facility]   Educational health offerings Not Applicable     Encounter Details   Primary purpose of visit Post ED/Hospitalization Visit;Other   Was an Emergency Department visit averted? Not Applicable   Does patient have a medical provider? No   Patient referred to Emergency Department;Not Applicable   Was a mental health screening completed? (GAINS tool) No   Does patient have dental issues? No   Does patient have vision issues? No   Does your patient have an abnormal blood pressure today? No   Since previous encounter, have you referred patient for abnormal blood pressure that resulted in a new diagnosis or medication change? No   Does your patient have an abnormal blood glucose today? No   Since previous encounter, have you referred patient for abnormal blood glucose that resulted in a new diagnosis or medication change? No   Was there a life-saving intervention made? No     Client was discharged from the ED after being medically cleared.  Currently there are no available beds at the homeless shelter.  Sport and exercise psychologisthelter director agreed to allowing client to remain in the lobby for three or four days.  [facility].  CSWEI intern and medical congregational nurse will continue to case manage client in an attempt to obtain appropriate health care and avoid living on the street.

## 2017-01-27 ENCOUNTER — Ambulatory Visit (INDEPENDENT_AMBULATORY_CARE_PROVIDER_SITE_OTHER): Payer: Medicare Other | Admitting: Family Medicine

## 2017-01-27 VITALS — BP 122/84 | HR 90 | Temp 98.3°F | Resp 18 | Wt 196.6 lb

## 2017-01-27 DIAGNOSIS — Z022 Encounter for examination for admission to residential institution: Secondary | ICD-10-CM

## 2017-01-27 DIAGNOSIS — F200 Paranoid schizophrenia: Secondary | ICD-10-CM

## 2017-01-27 DIAGNOSIS — Z111 Encounter for screening for respiratory tuberculosis: Secondary | ICD-10-CM

## 2017-01-27 DIAGNOSIS — I10 Essential (primary) hypertension: Secondary | ICD-10-CM

## 2017-01-27 MED ORDER — RISPERIDONE 1 MG PO TABS: 1.0000 mg | ORAL_TABLET | Freq: Two times a day (BID) | ORAL | 0 refills | Status: DC

## 2017-01-27 MED ORDER — BENZTROPINE MESYLATE 0.5 MG PO TABS: 0.5000 mg | ORAL_TABLET | Freq: Two times a day (BID) | ORAL | 0 refills | Status: DC

## 2017-01-27 NOTE — Patient Instructions (Addendum)
I completed the FL2 form to the best of our ability today. However you will need to have follow-up with psychiatry, so I will place a referral to psychiatry to establish care. I refilled your medications for 30 days for now, but those will need to be managed by psychiatry.   Please follow-up with me in the next 2 weeks to discuss your hip pain and use of walker and can determine next step including possible orthopedic evaluation.   Please monitor your blood pressure outside of the office, that can be checked at a neighborhood pharmacy or with a home monitor if available. If you are obtaining readings over 140/90, would recommend restarting medication. Let me know if that is the case and I can send some medication in.   IF you received an x-ray today, you will receive an invoice from [facility]. Please contact [facility] at [phone] with questions or concerns regarding your invoice.   IF you received labwork today, you will receive an invoice from [facility]. Please contact [facility] at [phone] with questions or concerns regarding your invoice.   Our billing staff will not be able to assist you with questions regarding bills from these companies.  You will be contacted with the lab results as soon as they are available. The fastest way to get your results is to activate your My Chart account. Instructions are located on the last page of this paperwork. If you have not heard from us regarding the results in 2 weeks, please contact this office.
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
## 2017-01-27 NOTE — Progress Notes (Signed)
  Tuberculosis Risk Questionnaire  1. No Were you born outside the [place] in one of the following parts of the world: [place], [place], [place], [place] America or Eastern [place]?    2. No Have you traveled outside the [place] and lived for more than one month in one of the following parts of the world: [place], [place], [place], [place] or [place]?    3. No Do you have a compromised immune system such as from any of the following conditions:HIV/AIDS, organ or bone marrow transplantation, diabetes, immunosuppressive medicines (e.g. Prednisone, Remicaide), leukemia, lymphoma, cancer of the head or neck, gastrectomy or jejunal bypass, end-stage renal disease (on dialysis), or silicosis?     4. 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    5. Yes  Have you ever: injected illegal drugs, used crack cocaine, lived in a homeless shelter  or been in jail or prison?     6. No Have you ever been exposed to anyone with infectious tuberculosis?    Tuberculosis Symptom Questionnaire  Do you currently have any of the following symptoms?  1. No Unexplained cough lasting more than 3 weeks?   2. No Unexplained fever lasting more than 3 weeks.   3. No Night Sweats (sweating that leaves the bedclothes and sheets wet)     4. No Shortness of Breath   5. No Chest Pain   6. No Unintentional weight loss    7. No Unexplained fatigue (very tired for no reason)   

## 2017-02-03 IMAGING — CR DG CHEST 2V
2 series · 2 of 2 positions shown · non-contrast
Comparison: October 22, 2016

CLINICAL DATA: Cough

EXAM:
CHEST  2 VIEW

[w chest pa]
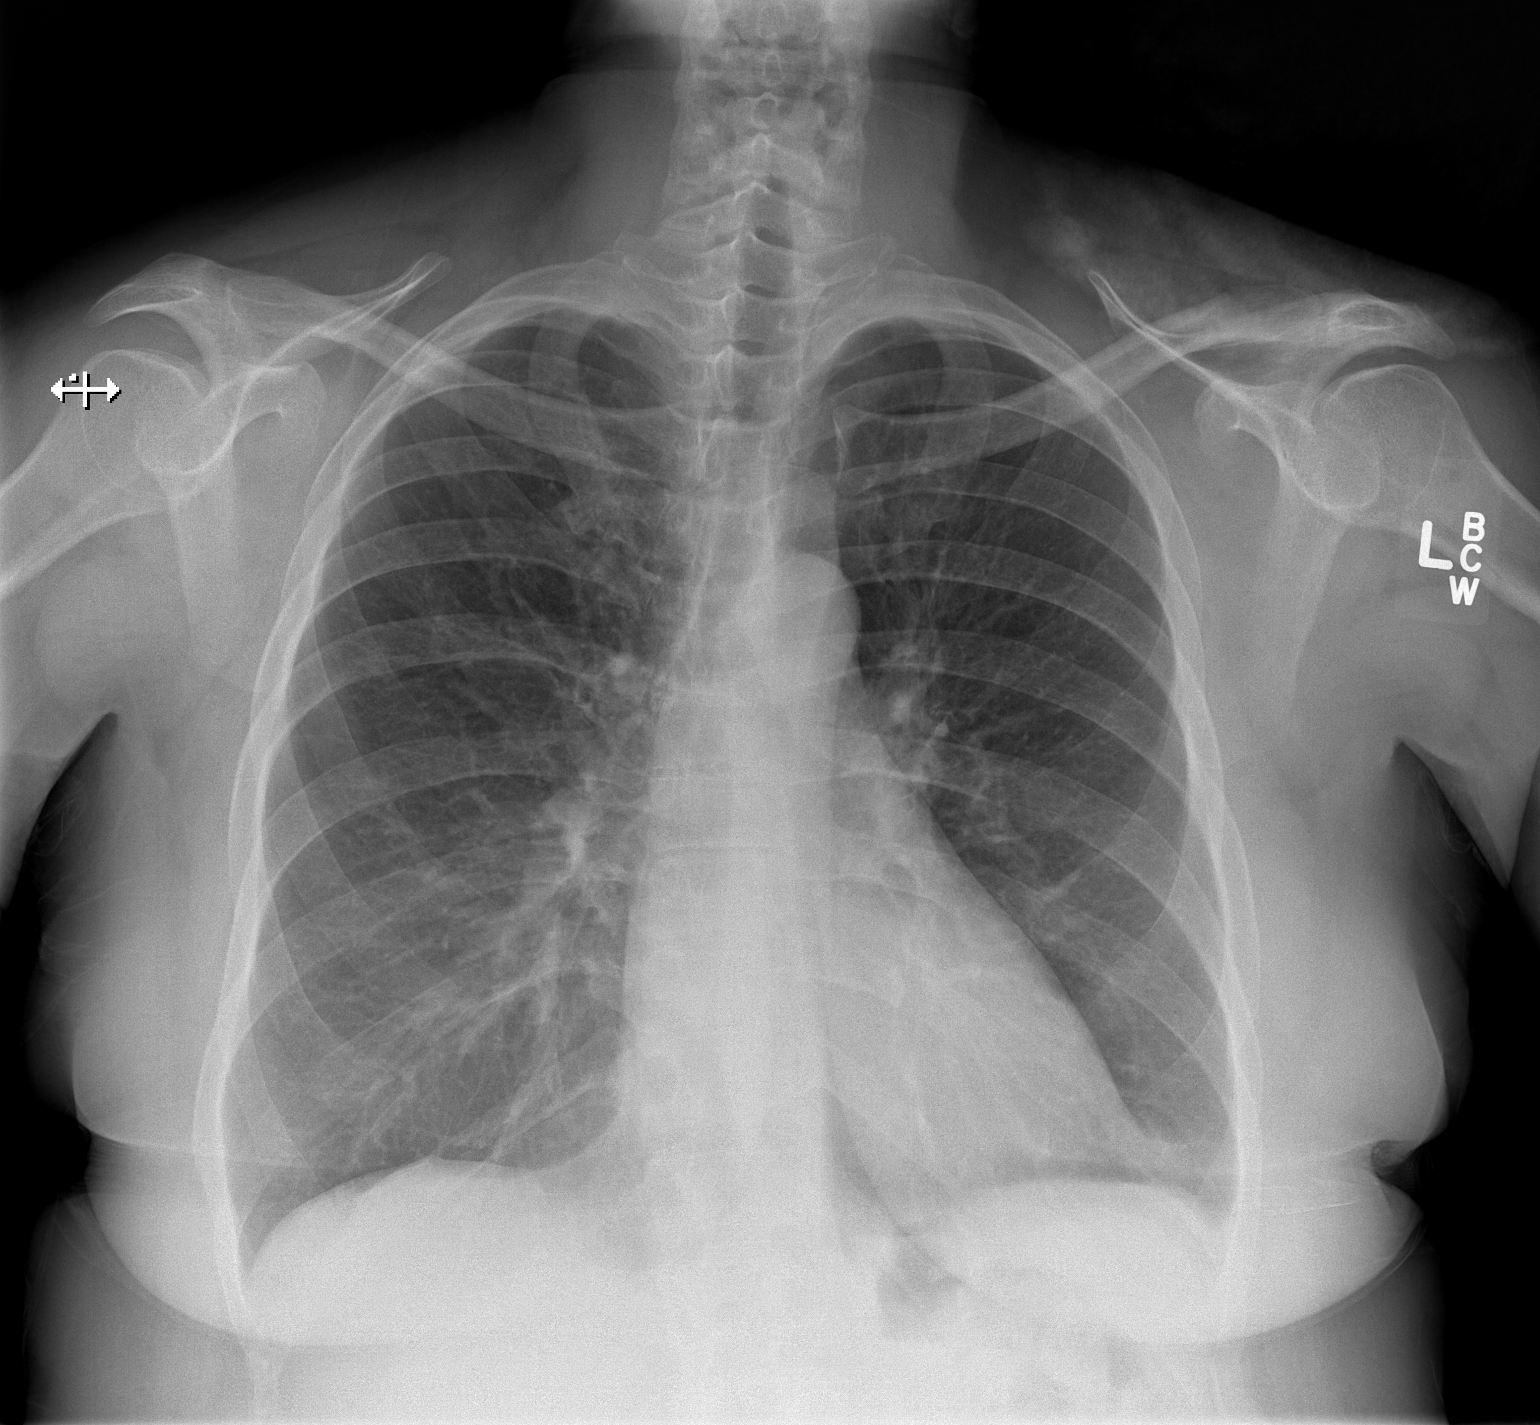

[w chest lat]
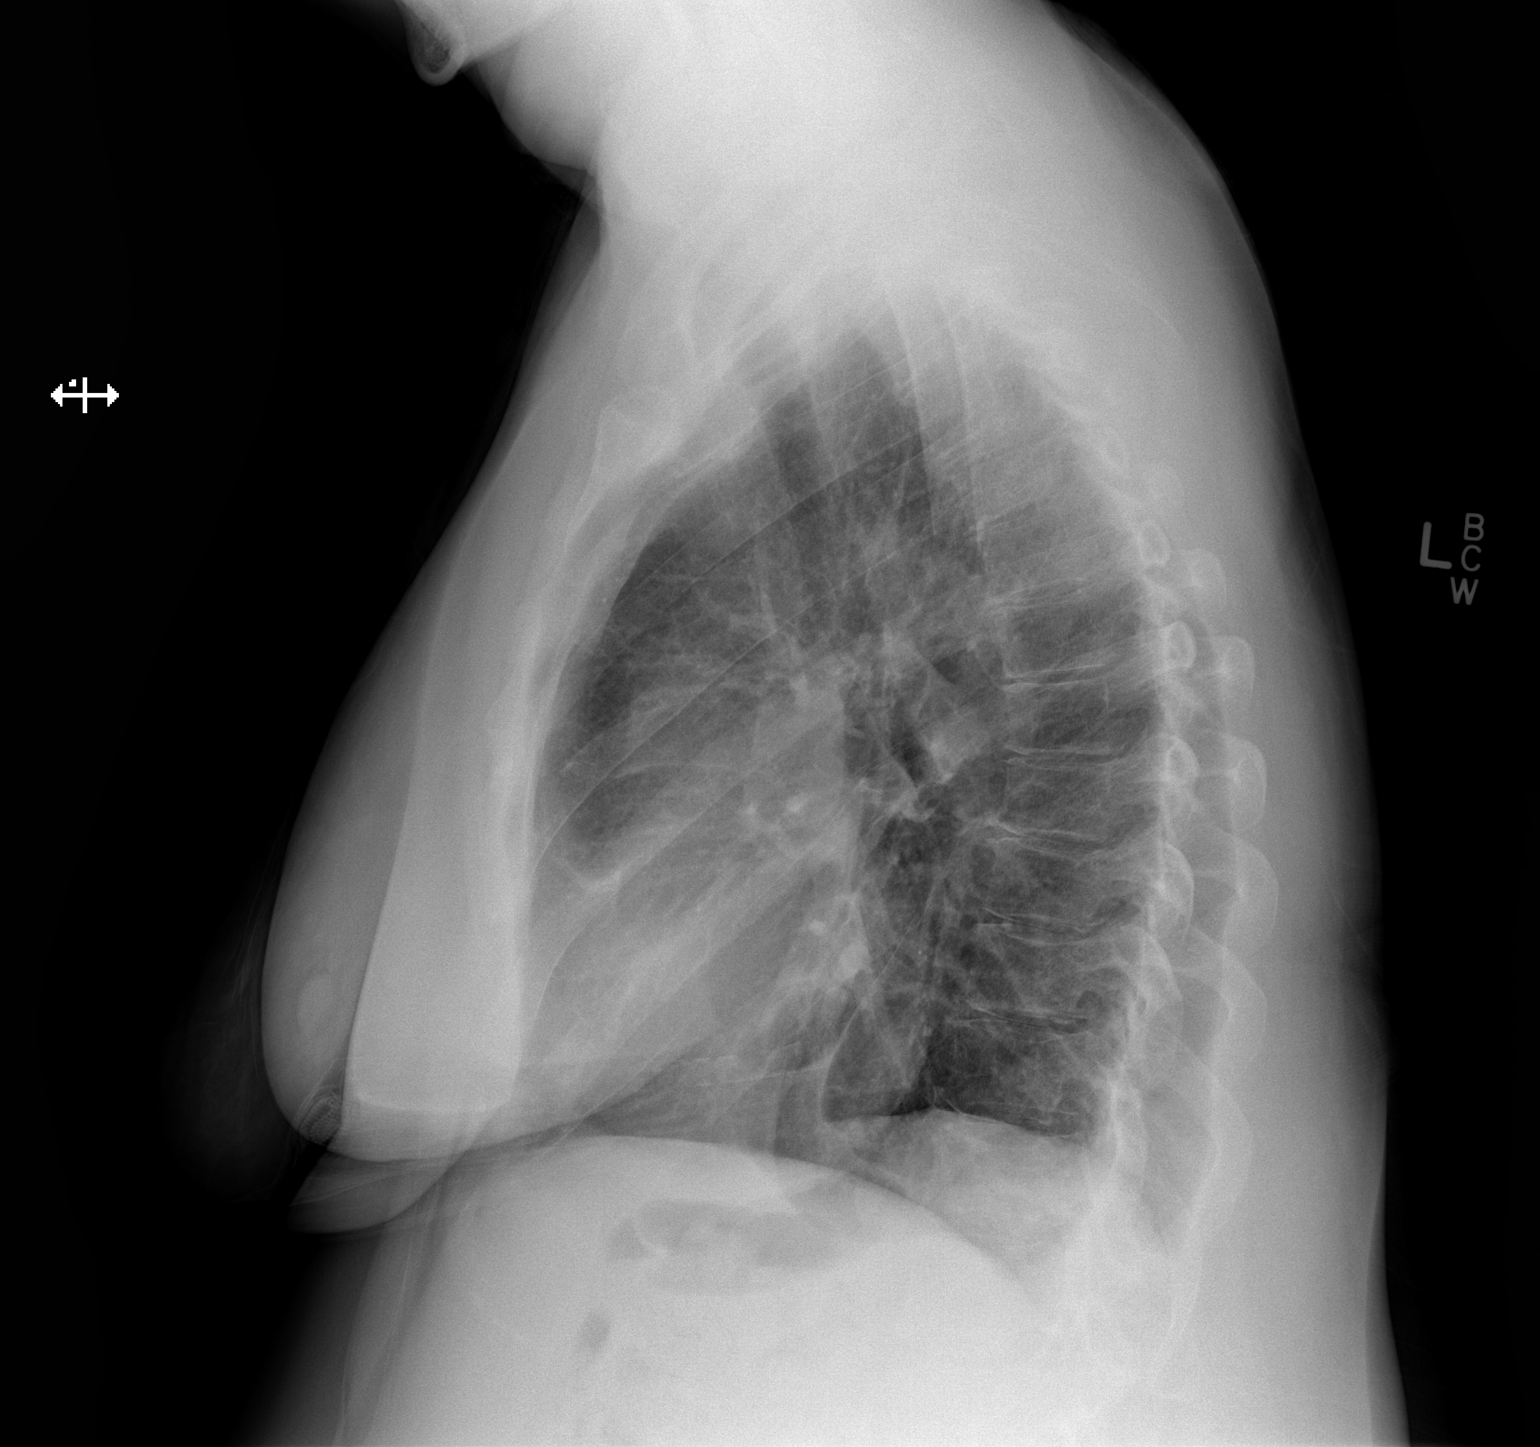

[2 of 2 positions shown; findings below may reference images not displayed]

FINDINGS: There is no edema or consolidation. Heart size and pulmonary
vascularity are normal. No adenopathy. No bone lesions.
IMPRESSION: No edema or consolidation.

## 2017-02-06 ENCOUNTER — Telehealth: Payer: Self-pay | Admitting: Family Medicine

## 2017-02-06 NOTE — Telephone Encounter (Signed)
In your box

## 2017-02-06 NOTE — Telephone Encounter (Signed)
Signed and faxed back.

## 2017-02-06 NOTE — Telephone Encounter (Signed)
[name] from home health care is calling back about FL2 paper work the fax number [phone] make it to Jasmine CokeATTN [name]

## 2017-02-07 ENCOUNTER — Other Ambulatory Visit: Payer: Self-pay | Admitting: Family Medicine

## 2017-02-07 DIAGNOSIS — F200 Paranoid schizophrenia: Secondary | ICD-10-CM

## 2017-02-08 ENCOUNTER — Telehealth: Payer: Self-pay | Admitting: Family Medicine

## 2017-02-08 NOTE — Telephone Encounter (Signed)
Receive paper work from [facility], place the paper work in Dr. Neva SeatGreene box.

## 2017-02-09 NOTE — Telephone Encounter (Signed)
This was filled at the patient's 3/16 visit

## 2017-02-14 NOTE — Telephone Encounter (Signed)
I have placed paperwork in Dr. Paralee Cancel box that I received 02/14/17 off the fax machine. The coversheet says this has to be faxed back today for chart compliance

## 2017-02-14 NOTE — Telephone Encounter (Signed)
Done.  Placed in fax box

## 2017-02-17 ENCOUNTER — Telehealth: Payer: Self-pay | Admitting: Emergency Medicine

## 2017-02-17 NOTE — Telephone Encounter (Signed)
[facility] Elease Hashimoto called requesting verbal orders on patient to receive Skilled Nursing once/week x 5 weeks and x2 PRN.  Please advise. Is it okay to give verbal order?

## 2017-02-17 NOTE — Telephone Encounter (Signed)
Should not be an issue, but what is skilled nursing addressing?

## 2017-02-18 NOTE — Telephone Encounter (Signed)
Where to call back to?

## 2017-02-24 ENCOUNTER — Other Ambulatory Visit: Payer: Self-pay | Admitting: Family Medicine

## 2017-02-24 DIAGNOSIS — F200 Paranoid schizophrenia: Secondary | ICD-10-CM

## 2017-02-25 NOTE — Telephone Encounter (Signed)
Refills provided for 1 month, but she needs to follow up with me in that time as planned follow up in 2 weeks when seen March 16th.  Also planned for psychiatry to assume prescribing of her psychotropics. Has she been established with psychiatrist yet, and if not can we check into status?

## 2017-03-02 ENCOUNTER — Other Ambulatory Visit: Payer: Self-pay | Admitting: Family Medicine

## 2017-03-02 DIAGNOSIS — F200 Paranoid schizophrenia: Secondary | ICD-10-CM

## 2017-03-03 NOTE — Telephone Encounter (Signed)
01/27/2017

## 2017-03-03 NOTE — Telephone Encounter (Signed)
See last refill request - I ordered one month of meds on 02/25/17 to allow time to see me.  Were these not filled?

## 2017-03-09 NOTE — Telephone Encounter (Signed)
Pharmacy advised to use 02/25/17 refill and advise pt needs visit for more

## 2017-04-06 ENCOUNTER — Other Ambulatory Visit: Payer: Self-pay | Admitting: Family Medicine

## 2017-04-06 DIAGNOSIS — F200 Paranoid schizophrenia: Secondary | ICD-10-CM

## 2017-04-10 NOTE — Telephone Encounter (Signed)
See previous refill request. I saw her one time to establish care and at that time plan on having her followed by psychiatry for these medications. I did prescribe them temporarily until she could be seen by psychiatry. What is the plan?  I will refill for 1 more month but need to know what the plan is for ongoing psychiatric care.

## 2017-04-10 NOTE — Telephone Encounter (Signed)
Last seen 01/2017 last ov

## 2017-06-06 ENCOUNTER — Other Ambulatory Visit: Payer: Self-pay | Admitting: Family Medicine

## 2017-06-06 DIAGNOSIS — F200 Paranoid schizophrenia: Secondary | ICD-10-CM

## 2017-06-07 NOTE — Telephone Encounter (Signed)
Patient has not been seen since 01/2017, was to return in 2 weeks. Please advise regarding refill for Risperidone and Cogentin. Thank you/ S.Dalya Maselli,CMA

## 2017-06-08 NOTE — Telephone Encounter (Signed)
Pt has repeatedly been asked to follow up with psychiatry to prescribe meds.  Attempted call to pt today - phone has been disconnected.   Refused meds - pharmacy will advise pt and if pt calls, get updated phone number.

## 2017-07-03 ENCOUNTER — Other Ambulatory Visit: Payer: Self-pay | Admitting: Family Medicine

## 2017-07-03 DIAGNOSIS — F200 Paranoid schizophrenia: Secondary | ICD-10-CM

## 2017-07-21 NOTE — Telephone Encounter (Signed)
See prior messages. Pt has repeatedly been asked to follow up with psychiatry to prescribe meds. Refused meds - if pt calls, get updated phone number.

## 2023-05-05 ENCOUNTER — Encounter: Payer: Medicare Other | Admitting: Gastroenterology
